# Patient Record
Sex: Female | Born: 1995 | Race: White | Hispanic: No | Marital: Married | Smoking: Former smoker
Health system: Southern US, Community
[De-identification: ages and names within clinical notes are randomized; demographics above are authoritative.]

## PROBLEM LIST (undated history)

## (undated) ENCOUNTER — Inpatient Hospital Stay (HOSPITAL_COMMUNITY): Payer: Self-pay

## (undated) DIAGNOSIS — N189 Chronic kidney disease, unspecified: Secondary | ICD-10-CM

## (undated) DIAGNOSIS — F319 Bipolar disorder, unspecified: Secondary | ICD-10-CM

## (undated) DIAGNOSIS — O1500 Eclampsia in pregnancy, unspecified trimester: Secondary | ICD-10-CM

## (undated) DIAGNOSIS — R569 Unspecified convulsions: Secondary | ICD-10-CM

## (undated) HISTORY — PX: APPENDECTOMY: SHX54

---
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
## 2013-02-10 ENCOUNTER — Emergency Department (HOSPITAL_BASED_OUTPATIENT_CLINIC_OR_DEPARTMENT_OTHER)
Admission: EM | Admit: 2013-02-10 | Discharge: 2013-02-11 | Disposition: A | Discharge status: Home / Self Care | Payer: BC Managed Care – PPO | Attending: Emergency Medicine | Admitting: Emergency Medicine

## 2013-02-10 ENCOUNTER — Encounter (HOSPITAL_BASED_OUTPATIENT_CLINIC_OR_DEPARTMENT_OTHER): Payer: Self-pay | Admitting: *Deleted

## 2013-02-10 DIAGNOSIS — N189 Chronic kidney disease, unspecified: Secondary | ICD-10-CM | POA: Insufficient documentation

## 2013-02-10 DIAGNOSIS — L0390 Cellulitis, unspecified: Secondary | ICD-10-CM

## 2013-02-10 DIAGNOSIS — L03818 Cellulitis of other sites: Secondary | ICD-10-CM | POA: Insufficient documentation

## 2013-02-10 DIAGNOSIS — Z79899 Other long term (current) drug therapy: Secondary | ICD-10-CM | POA: Insufficient documentation

## 2013-02-10 DIAGNOSIS — L02818 Cutaneous abscess of other sites: Secondary | ICD-10-CM | POA: Insufficient documentation

## 2013-02-10 DIAGNOSIS — Z8669 Personal history of other diseases of the nervous system and sense organs: Secondary | ICD-10-CM | POA: Insufficient documentation

## 2013-02-10 DIAGNOSIS — Z87442 Personal history of urinary calculi: Secondary | ICD-10-CM | POA: Insufficient documentation

## 2013-02-10 MED ORDER — AMOXICILLIN-POT CLAVULANATE 875-125 MG PO TABS: 1.0000 | ORAL_TABLET | Freq: Two times a day (BID) | ORAL | Status: DC

## 2013-02-10 NOTE — ED Notes (Signed)
Pt has raised reddened area to forehead.

## 2013-02-10 NOTE — ED Provider Notes (Signed)
History    This chart was scribed for Olivia Deatley Smitty Cords, MD scribed by Magnus Sinning. The patient was seen in room MH02/MH02 23:45    CSN: [id number]  Arrival date & time 02/10/13  2152    Chief Complaint  Patient presents with  . Abscess    (Consider location/radiation/quality/duration/timing/severity/associated sxs/prior treatment) Patient is a 17 y.o. female presenting with abscess. The history is provided by the patient.  Abscess Location:  Head/neck Head/neck abscess location:  Head Size:  Pimple Abscess quality: redness   Abscess quality: not weeping   Red streaking: no   Progression:  Unchanged Chronicity:  New Context: skin injury   Context comment:  Picked the pimple Relieved by:  Nothing Worsened by:  Nothing tried Ineffective treatments:  None tried Associated symptoms: no fever    Olivia [name] is a 17 y.o. female who presents to the Emergency Department complaining of a raised area to forehead, onset several days ago with associated redness and swelling. The patient states she noticed a small pimple to her forehead. She says that she popped it and that it improved initially, but then worsened since this morning after waking up.   Past Medical History  Diagnosis Date  . Seizures 10-21-2012    x 2 ; No prior history  . Chronic kidney disease     Frequent kidney stones; stent placed 2012    Past Surgical History  Procedure Laterality Date  . Cystoscopy with stent placement  2012  . Cesarean section  10/22/2012    Procedure: CESAREAN SECTION;  Surgeon: Levi Aland, MD;  Location: [facility];  Service: Obstetrics;  Laterality: N/A;    Family History  Problem Relation Age of Onset  . Other Neg Hx   . Hyperlipidemia Mother   . Coronary artery disease Mother 55    S/P CABG    History  Substance Use Topics  . Smoking status: Never Smoker   . Smokeless tobacco: Never Used  . Alcohol Use: No    OB History   Grav Para Term Preterm Abortions  TAB SAB Ect Mult Living   1 1 1  0 0 0 0 0 0 1      Review of Systems  Constitutional: Negative for fever.  Skin:       Red raised area to forehead  All other systems reviewed and are negative.    Allergies  Review of patient's allergies indicates no known allergies.  Home Medications   Current Outpatient Rx  Name  Route  Sig  Dispense  Refill  . acetaminophen (TYLENOL) 325 MG tablet   Oral   Take 325 mg by mouth every 6 (six) hours as needed. Takes for headaches         . IRON PO   Oral   Take 1 tablet by mouth daily.         Marland Kitchen LORazepam (ATIVAN) 1 MG tablet   Oral   Take 1 tablet (1 mg total) by mouth every 6 (six) hours as needed for anxiety.   20 tablet   0   . NIFEdipine (PROCARDIA-XL/ADALAT CC) 30 MG 24 hr tablet   Oral   Take 1 tablet (30 mg total) by mouth daily.   30 tablet   1   . oxyCODONE-acetaminophen (PERCOCET/ROXICET) 5-325 MG per tablet   Oral   Take 1-2 tablets by mouth every 4 (four) hours as needed (moderate - severe pain).   30 tablet   0   . Prenatal  Vit-Fe Fumarate-FA (PRENATAL MULTIVITAMIN) TABS   Oral   Take 1 tablet by mouth daily.           BP 120/68  Pulse 80  Temp(Src) 98.6 F (37 C) (Oral)  Resp 18  Ht 5\' 1"  (1.549 m)  Wt 114 lb (51.71 kg)  BMI 21.55 kg/m2  SpO2 99%  LMP 02/03/2013  Breastfeeding? No  Physical Exam  Nursing note and vitals reviewed. Constitutional: She is oriented to person, place, and time. She appears well-developed and well-nourished. No distress.  HENT:  Head: Normocephalic and atraumatic.    Mouth/Throat: Oropharynx is clear and moist. No oropharyngeal exudate.  Eyes: Conjunctivae and EOM are normal. Pupils are equal, round, and reactive to light.  Neck: Neck supple. No tracheal deviation present.  Cardiovascular: Normal rate, regular rhythm and normal heart sounds.   No murmur heard. Pulmonary/Chest: Breath sounds normal. No respiratory distress. She has no wheezes. She has no rales.   Abdominal: Soft. Bowel sounds are normal. She exhibits no distension. There is no tenderness.  Musculoskeletal: Normal range of motion.  Neurological: She is alert and oriented to person, place, and time. No sensory deficit.  Skin: Skin is warm and dry.  Sebaceous gland that has now become infected with now looks like cellulitis. Surrounding swelling noted.   Psychiatric: She has a normal mood and affect. Her behavior is normal.    ED Course  Procedures (including critical care time) DIAGNOSTIC STUDIES: Oxygen Saturation is 99% on room air, normal by my interpretation.    COORDINATION OF CARE: 23:46: physical exam performed   Labs Reviewed - No data to display No results found.   No diagnosis found.    MDM  Recheck in 2 days, sooner for worsening swelling, fevers, streaking or any concerns.  Follow up with facial.   I personally performed the services described in this documentation, which was scribed in my presence. The recorded information has been reviewed and is accurate.          Jasmine Awe, MD 02/11/13 [phone]

## 2013-02-11 ENCOUNTER — Encounter (HOSPITAL_BASED_OUTPATIENT_CLINIC_OR_DEPARTMENT_OTHER): Payer: Self-pay

## 2013-02-11 ENCOUNTER — Emergency Department (HOSPITAL_BASED_OUTPATIENT_CLINIC_OR_DEPARTMENT_OTHER)
Admission: EM | Admit: 2013-02-11 | Discharge: 2013-02-11 | Disposition: A | Discharge status: Home / Self Care | Payer: BC Managed Care – PPO | Attending: Emergency Medicine | Admitting: Emergency Medicine

## 2013-02-11 DIAGNOSIS — Z8669 Personal history of other diseases of the nervous system and sense organs: Secondary | ICD-10-CM | POA: Insufficient documentation

## 2013-02-11 DIAGNOSIS — Z87442 Personal history of urinary calculi: Secondary | ICD-10-CM | POA: Insufficient documentation

## 2013-02-11 DIAGNOSIS — L0201 Cutaneous abscess of face: Secondary | ICD-10-CM | POA: Insufficient documentation

## 2013-02-11 DIAGNOSIS — L03211 Cellulitis of face: Secondary | ICD-10-CM | POA: Insufficient documentation

## 2013-02-11 DIAGNOSIS — Z79899 Other long term (current) drug therapy: Secondary | ICD-10-CM | POA: Insufficient documentation

## 2013-02-11 DIAGNOSIS — Z5189 Encounter for other specified aftercare: Secondary | ICD-10-CM

## 2013-02-11 MED ORDER — AMOXICILLIN-POT CLAVULANATE 875-125 MG PO TABS
1.0000 | ORAL_TABLET | Freq: Once | ORAL | Status: AC
  Administered 2013-02-11: 1 via ORAL
  Filled 2013-02-11: qty 1

## 2013-02-11 MED ORDER — LIDOCAINE-EPINEPHRINE 2 %-1:100000 IJ SOLN
INTRAMUSCULAR | Status: AC
  Administered 2013-02-11: 1 mL
  Filled 2013-02-11: qty 1

## 2013-02-11 NOTE — ED Notes (Signed)
Pt states that she had abscess to R upper orbit.  Pt states that she now has incr swelling to R lower orbit.  Pt states that she her face feels tighter in this area despite minor looking appearance.

## 2013-02-11 NOTE — ED Provider Notes (Addendum)
History     CSN: [id number]  Arrival date & time 02/11/13  1155   First MD Initiated Contact with Patient 02/11/13 1217      Chief Complaint  Patient presents with  . Wound Check    (Consider location/radiation/quality/duration/timing/severity/associated sxs/prior treatment) Patient is a 17 y.o. female presenting with wound check. The history is provided by the patient.  Wound Check This is a new problem. The current episode started 2 days ago. The problem occurs constantly. The problem has been rapidly worsening. Associated symptoms comments: Seen in the ER last night and given augmentin for abscess but now worsening with swelling under the eye and worsening pain. Exacerbated by: Palpation. Nothing relieves the symptoms. Treatments tried: Antibiotic. The treatment provided no relief.    Past Medical History  Diagnosis Date  . Seizures 10-21-2012    x 2 ; No prior history  . Chronic kidney disease     Frequent kidney stones; stent placed 2012    Past Surgical History  Procedure Laterality Date  . Cystoscopy with stent placement  2012  . Cesarean section  10/22/2012    Procedure: CESAREAN SECTION;  Surgeon: Levi Aland, MD;  Location: [facility];  Service: Obstetrics;  Laterality: N/A;    Family History  Problem Relation Age of Onset  . Other Neg Hx   . Hyperlipidemia Mother   . Coronary artery disease Mother 33    S/P CABG    History  Substance Use Topics  . Smoking status: Never Smoker   . Smokeless tobacco: Never Used  . Alcohol Use: No    OB History   Grav Para Term Preterm Abortions TAB SAB Ect Mult Living   1 1 1  0 0 0 0 0 0 1      Review of Systems  Constitutional: Negative for fever.  Eyes: Negative for pain and redness.  All other systems reviewed and are negative.    Allergies  Review of patient's allergies indicates no known allergies.  Home Medications   Current Outpatient Rx  Name  Route  Sig  Dispense  Refill  . amoxicillin-clavulanate  (AUGMENTIN) 875-125 MG per tablet   Oral   Take 1 tablet by mouth 2 (two) times daily. One po bid x 7 days   14 tablet   0   . IRON PO   Oral   Take 1 tablet by mouth daily.         Marland Kitchen LORazepam (ATIVAN) 1 MG tablet   Oral   Take 1 tablet (1 mg total) by mouth every 6 (six) hours as needed for anxiety.   20 tablet   0   . naproxen (NAPROSYN) 500 MG tablet   Oral   Take 500 mg by mouth 2 (two) times daily with a meal.         . acetaminophen (TYLENOL) 325 MG tablet   Oral   Take 325 mg by mouth every 6 (six) hours as needed. Takes for headaches         . NIFEdipine (PROCARDIA-XL/ADALAT CC) 30 MG 24 hr tablet   Oral   Take 1 tablet (30 mg total) by mouth daily.   30 tablet   1   . oxyCODONE-acetaminophen (PERCOCET/ROXICET) 5-325 MG per tablet   Oral   Take 1-2 tablets by mouth every 4 (four) hours as needed (moderate - severe pain).   30 tablet   0   . Prenatal Vit-Fe Fumarate-FA (PRENATAL MULTIVITAMIN) TABS   Oral  Take 1 tablet by mouth daily.           BP 111/72  Pulse 97  Temp(Src) 99.2 F (37.3 C) (Oral)  Resp 15  Ht 5\' 1"  (1.549 m)  Wt 114 lb (51.71 kg)  BMI 21.55 kg/m2  SpO2 100%  LMP 02/03/2013  Physical Exam  Nursing note and vitals reviewed. Constitutional: She appears well-developed and well-nourished. No distress.  HENT:  Head: Normocephalic and atraumatic.    Eyes: Conjunctivae and EOM are normal. Pupils are equal, round, and reactive to light.  Extraocular movements are not painful  Cardiovascular: Normal rate.   Pulmonary/Chest: Effort normal.  Neurological: She is alert.  Skin: Skin is warm and dry.    ED Course  Procedures (including critical care time)  Labs Reviewed - No data to display No results found.  INCISION AND DRAINAGE Performed by: Gwyneth Sprout Consent: Verbal consent obtained. Risks and benefits: risks, benefits and alternatives were discussed Type: abscess  Body area: Right eyebrow  Anesthesia:  local infiltration  Incision was made with an 18-gauge needle  Local anesthetic: lidocaine 1 % with epinephrine  Anesthetic total: 2 ml  Complexity: Simple  Drainage: purulent  Drainage amount: 2   Packing material: none  Patient tolerance: Patient tolerated the procedure well with no immediate complications.     1. Abscess re-check       MDM   Patient was seen within the last 24 hours for facial abscess and was started on Augmentin however at that time there was no drainage or I&D. Patient states her symptoms are worsening and now having pain and swelling with swelling in the infraorbital area. She has no symptoms concerning for periorbital or orbital cellulitis. I&D performed with moderate post drainage. Patient encouraged to continue antibiotics and do warm soaks and return for any worsening symptoms.        Gwyneth Sprout, MD 02/11/13 1242  Gwyneth Sprout, MD 02/11/13 1244

## 2013-04-06 DIAGNOSIS — N189 Chronic kidney disease, unspecified: Secondary | ICD-10-CM | POA: Insufficient documentation

## 2013-04-06 DIAGNOSIS — S1093XA Contusion of unspecified part of neck, initial encounter: Secondary | ICD-10-CM | POA: Insufficient documentation

## 2013-04-06 DIAGNOSIS — W2209XA Striking against other stationary object, initial encounter: Secondary | ICD-10-CM | POA: Insufficient documentation

## 2013-04-06 DIAGNOSIS — Y92009 Unspecified place in unspecified non-institutional (private) residence as the place of occurrence of the external cause: Secondary | ICD-10-CM | POA: Insufficient documentation

## 2013-04-06 DIAGNOSIS — S8990XA Unspecified injury of unspecified lower leg, initial encounter: Secondary | ICD-10-CM | POA: Insufficient documentation

## 2013-04-06 DIAGNOSIS — Y9301 Activity, walking, marching and hiking: Secondary | ICD-10-CM | POA: Insufficient documentation

## 2013-04-06 DIAGNOSIS — Z79899 Other long term (current) drug therapy: Secondary | ICD-10-CM | POA: Insufficient documentation

## 2013-04-06 DIAGNOSIS — S0003XA Contusion of scalp, initial encounter: Secondary | ICD-10-CM | POA: Insufficient documentation

## 2013-04-06 DIAGNOSIS — Z8669 Personal history of other diseases of the nervous system and sense organs: Secondary | ICD-10-CM | POA: Insufficient documentation

## 2013-04-06 DIAGNOSIS — S99919A Unspecified injury of unspecified ankle, initial encounter: Secondary | ICD-10-CM | POA: Insufficient documentation

## 2013-04-07 ENCOUNTER — Emergency Department (HOSPITAL_COMMUNITY)
Admission: EM | Admit: 2013-04-07 | Discharge: 2013-04-07 | Disposition: A | Discharge status: Home / Self Care | Payer: BC Managed Care – PPO | Attending: Emergency Medicine | Admitting: Emergency Medicine

## 2013-04-07 ENCOUNTER — Emergency Department (HOSPITAL_COMMUNITY): Payer: BC Managed Care – PPO

## 2013-04-07 ENCOUNTER — Encounter (HOSPITAL_COMMUNITY): Payer: Self-pay | Admitting: Emergency Medicine

## 2013-04-07 DIAGNOSIS — M79675 Pain in left toe(s): Secondary | ICD-10-CM

## 2013-04-07 DIAGNOSIS — S0083XA Contusion of other part of head, initial encounter: Secondary | ICD-10-CM

## 2013-04-07 MED ORDER — NAPROXEN 500 MG PO TABS: 500.0000 mg | ORAL_TABLET | Freq: Two times a day (BID) | ORAL | Status: DC

## 2013-04-07 NOTE — ED Provider Notes (Signed)
History     CSN: [id number]  Arrival date & time 04/06/13  2355   First MD Initiated Contact with Patient 04/07/13 0050      Chief Complaint  Patient presents with  . Facial Pain    (Consider location/radiation/quality/duration/timing/severity/associated sxs/prior treatment) HPI Olivia [name] is a 17 y.o. female who presents to ER for 2 separate mechanical injuries that were both accidental per pt. 1- she was walking and kicked her night stand stubbing her left pinky toe. 2 she was opening door and hit the left side of her face and has a mild HA and nausea since. She denies change in vision, LOC, emesis or ataxia. Pain level is 3/10.   Past Medical History  Diagnosis Date  . Seizures 10-21-2012    x 2 ; No prior history  . Chronic kidney disease     Frequent kidney stones; stent placed 2012    Past Surgical History  Procedure Laterality Date  . Cystoscopy with stent placement  2012  . Cesarean section  10/22/2012    Procedure: CESAREAN SECTION;  Surgeon: Levi Aland, MD;  Location: [facility];  Service: Obstetrics;  Laterality: N/A;    Family History  Problem Relation Age of Onset  . Other Neg Hx   . Hyperlipidemia Mother   . Coronary artery disease Mother 1    S/P CABG    History  Substance Use Topics  . Smoking status: Never Smoker   . Smokeless tobacco: Never Used  . Alcohol Use: No    OB History   Grav Para Term Preterm Abortions TAB SAB Ect Mult Living   1 1 1  0 0 0 0 0 0 1      Review of Systems  All other systems reviewed and are negative.    Allergies  Review of patient's allergies indicates no known allergies.  Home Medications   Current Outpatient Rx  Name  Route  Sig  Dispense  Refill  . ibuprofen (ADVIL,MOTRIN) 200 MG tablet   Oral   Take 400 mg by mouth every 6 (six) hours as needed for pain.         . Levonorgestrel-Ethinyl Estrad (PORTIA-28 PO)   Oral   Take 1 tablet by mouth daily.         Marland Kitchen LORazepam (ATIVAN) 0.5 MG  tablet   Oral   Take 0.5 mg by mouth every 8 (eight) hours as needed for anxiety.           BP 115/74  Temp(Src) 98.2 F (36.8 C) (Oral)  Resp 18  SpO2 99%  LMP 03/30/2013  Physical Exam  Nursing note and vitals reviewed. Constitutional: She appears well-developed and well-nourished. No distress.  HENT:  Head: Normocephalic.  Mild facial bruising & swelling over the inferior orbit.   Eyes: Conjunctivae and EOM are normal.  No entrapment  Neck: Normal range of motion. Neck supple.  Cardiovascular:  Intact distal pulses, capillary refill < 3 seconds  Musculoskeletal:  Left pinky tender w bruising, & painful ROM. All other extremities with normal ROM  Neurological:  No ataxia, good coordination. CN intact No sensory deficit  Skin: She is not diaphoretic.  Skin intact, no tenting    ED Course  Procedures (including critical care time)  Labs Reviewed - No data to display Dg Foot Complete Left  04/07/2013   *RADIOLOGY REPORT*  Clinical Data: Lateral foot pain after injury.  LEFT FOOT - COMPLETE 3+ VIEW  Comparison: None.  Findings: No fracture or  acute bony findings.  Lisfranc joint alignment normal.  Base of the fifth metatarsal intact.  IMPRESSION:  1.  No acute bony findings.   Original Report Authenticated By: Gaylyn Rong, M.D.     1. Toe pain, left   2. Facial bruising, initial encounter       MDM          Jaci Carrel, PA-C 04/07/13 0559

## 2013-04-07 NOTE — ED Notes (Signed)
Pt reports that she thinks she may have broken her left foot while kicking a nightstand

## 2013-04-07 NOTE — ED Notes (Signed)
PT. ACCIDENTALLY HIT BY A DOOR AT LEFT SIDE OF FACE THIS EVENING , NO LOC / AMBULATORY , REPORTS PAIN AT LEFT LATERAL FOREHEAD / LEFT CHEEK AND BACK OF HEAD .

## 2013-04-07 NOTE — ED Provider Notes (Signed)
Medical screening examination/treatment/procedure(s) were performed by non-physician practitioner and as supervising physician I was immediately available for consultation/collaboration.  Oluwadamilare Tobler M Yvon Mccord, MD 04/07/13 0627 

## 2013-06-12 ENCOUNTER — Encounter (HOSPITAL_BASED_OUTPATIENT_CLINIC_OR_DEPARTMENT_OTHER): Payer: Self-pay | Admitting: *Deleted

## 2013-06-12 ENCOUNTER — Emergency Department (HOSPITAL_BASED_OUTPATIENT_CLINIC_OR_DEPARTMENT_OTHER)
Admission: EM | Admit: 2013-06-12 | Discharge: 2013-06-12 | Disposition: A | Discharge status: Home / Self Care | Payer: BC Managed Care – PPO | Attending: Emergency Medicine | Admitting: Emergency Medicine

## 2013-06-12 ENCOUNTER — Emergency Department (HOSPITAL_BASED_OUTPATIENT_CLINIC_OR_DEPARTMENT_OTHER): Payer: BC Managed Care – PPO

## 2013-06-12 DIAGNOSIS — B9689 Other specified bacterial agents as the cause of diseases classified elsewhere: Secondary | ICD-10-CM | POA: Insufficient documentation

## 2013-06-12 DIAGNOSIS — Z87442 Personal history of urinary calculi: Secondary | ICD-10-CM | POA: Insufficient documentation

## 2013-06-12 DIAGNOSIS — R109 Unspecified abdominal pain: Secondary | ICD-10-CM | POA: Insufficient documentation

## 2013-06-12 DIAGNOSIS — Z8669 Personal history of other diseases of the nervous system and sense organs: Secondary | ICD-10-CM | POA: Insufficient documentation

## 2013-06-12 DIAGNOSIS — Z3202 Encounter for pregnancy test, result negative: Secondary | ICD-10-CM | POA: Insufficient documentation

## 2013-06-12 DIAGNOSIS — R110 Nausea: Secondary | ICD-10-CM | POA: Insufficient documentation

## 2013-06-12 DIAGNOSIS — A499 Bacterial infection, unspecified: Secondary | ICD-10-CM | POA: Insufficient documentation

## 2013-06-12 DIAGNOSIS — N189 Chronic kidney disease, unspecified: Secondary | ICD-10-CM | POA: Insufficient documentation

## 2013-06-12 DIAGNOSIS — N760 Acute vaginitis: Secondary | ICD-10-CM

## 2013-06-12 LAB — URINALYSIS, ROUTINE W REFLEX MICROSCOPIC
Bilirubin Urine: NEGATIVE
Nitrite: NEGATIVE
Protein, ur: 100 mg/dL — AB
Specific Gravity, Urine: 1.024 (ref 1.005–1.030)
Urobilinogen, UA: 1 mg/dL (ref 0.0–1.0)

## 2013-06-12 LAB — URINE MICROSCOPIC-ADD ON

## 2013-06-12 LAB — WET PREP, GENITAL
Trich, Wet Prep: NONE SEEN
Yeast Wet Prep HPF POC: NONE SEEN

## 2013-06-12 MED ORDER — OXYCODONE-ACETAMINOPHEN 5-325 MG PO TABS: 1.0000 | ORAL_TABLET | ORAL | Status: DC | PRN

## 2013-06-12 MED ORDER — ONDANSETRON HCL 4 MG/2ML IJ SOLN
4.0000 mg | Freq: Once | INTRAMUSCULAR | Status: AC
  Administered 2013-06-12: 4 mg via INTRAVENOUS

## 2013-06-12 MED ORDER — METRONIDAZOLE 500 MG PO TABS
500.0000 mg | ORAL_TABLET | Freq: Once | ORAL | Status: AC
  Administered 2013-06-12: 500 mg via ORAL
  Filled 2013-06-12: qty 1

## 2013-06-12 MED ORDER — HYDROMORPHONE HCL PF 1 MG/ML IJ SOLN
1.0000 mg | Freq: Once | INTRAMUSCULAR | Status: AC
  Administered 2013-06-12: 1 mg via INTRAVENOUS

## 2013-06-12 MED ORDER — METRONIDAZOLE 500 MG PO TABS: 500.0000 mg | ORAL_TABLET | Freq: Two times a day (BID) | ORAL | Status: DC

## 2013-06-12 MED ORDER — ONDANSETRON HCL 4 MG/2ML IJ SOLN
INTRAMUSCULAR | Status: AC
  Filled 2013-06-12: qty 2

## 2013-06-12 MED ORDER — HYDROMORPHONE HCL PF 1 MG/ML IJ SOLN
INTRAMUSCULAR | Status: AC
  Filled 2013-06-12: qty 1

## 2013-06-12 MED ORDER — OXYCODONE-ACETAMINOPHEN 5-325 MG PO TABS
1.0000 | ORAL_TABLET | Freq: Once | ORAL | Status: AC
  Administered 2013-06-12: 1 via ORAL
  Filled 2013-06-12 (×2): qty 1

## 2013-06-12 NOTE — ED Provider Notes (Addendum)
History    CSN: [id number] Arrival date & time 06/12/13  1752  First MD Initiated Contact with Patient 06/12/13 1800     Chief Complaint  Patient presents with  . Flank Pain   (Consider location/radiation/quality/duration/timing/severity/associated sxs/prior Treatment) Patient is a 17 y.o. female presenting with flank pain. The history is provided by the patient. No language interpreter was used.  Flank Pain This is a new (Patient is a 17 year old woman with a history of kidney stones who had the onset of left flank pain on 1 AM. The pain comes and goes. It is slowly migrated into her left lower quadrant.) problem. The current episode started 12 to 24 hours ago. Episode frequency: Pain comes and goes. The problem has not changed since onset.Pertinent negatives include no chest pain, no abdominal pain, no headaches and no shortness of breath. Nothing aggravates the symptoms. Nothing relieves the symptoms. She has tried nothing for the symptoms.   Past Medical History  Diagnosis Date  . Seizures 10-21-2012    x 2 ; No prior history  . Chronic kidney disease     Frequent kidney stones; stent placed 2012   Past Surgical History  Procedure Laterality Date  . Cystoscopy with stent placement  2012  . Cesarean section  10/22/2012    Procedure: CESAREAN SECTION;  Surgeon: Levi Aland, MD;  Location: [facility];  Service: Obstetrics;  Laterality: N/A;   Family History  Problem Relation Age of Onset  . Other Neg Hx   . Hyperlipidemia Mother   . Coronary artery disease Mother 52    S/P CABG   History  Substance Use Topics  . Smoking status: Never Smoker   . Smokeless tobacco: Never Used  . Alcohol Use: No   OB History   Grav Para Term Preterm Abortions TAB SAB Ect Mult Living   1 1 1  0 0 0 0 0 0 1     Review of Systems  Constitutional: Negative for fever and chills.  HENT: Negative.   Eyes: Negative.   Respiratory: Negative.  Negative for shortness of breath.    Cardiovascular: Negative for chest pain.  Gastrointestinal: Positive for nausea. Negative for vomiting, abdominal pain and diarrhea.  Genitourinary: Positive for flank pain. Negative for hematuria and menstrual problem.       LMP 3 wweks ago.  Prior kidney stent and stone surgery at [facility] 3 years ago.   Musculoskeletal: Negative.   Skin: Negative.   Neurological: Negative.  Negative for headaches.  Psychiatric/Behavioral: Negative.     Allergies  Review of patient's allergies indicates no known allergies.  Home Medications   Current Outpatient Rx  Name  Route  Sig  Dispense  Refill  . FLUoxetine (PROZAC) 20 MG capsule   Oral   Take 20 mg by mouth daily.         Marland Kitchen ibuprofen (ADVIL,MOTRIN) 200 MG tablet   Oral   Take 400 mg by mouth every 6 (six) hours as needed for pain.         . Levonorgestrel-Ethinyl Estrad (PORTIA-28 PO)   Oral   Take 1 tablet by mouth daily.         Marland Kitchen LORazepam (ATIVAN) 0.5 MG tablet   Oral   Take 0.5 mg by mouth every 8 (eight) hours as needed for anxiety.         . naproxen (NAPROSYN) 500 MG tablet   Oral   Take 1 tablet (500 mg total) by mouth 2 (two) times  daily.   30 tablet   0    BP 113/74  Pulse 98  Temp(Src) 98.7 F (37.1 C) (Oral)  Resp 20  Wt 114 lb (51.71 kg)  BMI 21.55 kg/m2  SpO2 99%  LMP 05/22/2013 Physical Exam  Constitutional: She is oriented to person, place, and time. She appears well-developed and well-nourished.  In moderate distress with left flank pain, rated at a 5.    HENT:  Head: Normocephalic and atraumatic.  Right Ear: External ear normal.  Left Ear: External ear normal.  Mouth/Throat: Oropharynx is clear and moist.  Eyes: Conjunctivae are normal. Pupils are equal, round, and reactive to light. No scleral icterus.  Neck: Normal range of motion. Neck supple.  Cardiovascular: Normal rate, regular rhythm and normal heart sounds.   Pulmonary/Chest: Effort normal and breath sounds normal.  Abdominal:  Soft. Bowel sounds are normal.  Genitourinary:  Normal female external genitalia.  She has a thick white discharge.  No uterine or adnexal tenderness or mass.  Musculoskeletal: Normal range of motion. She exhibits no edema and no tenderness.  Pain is localized by pt to L CVA region with radiation towards the LLQ.  She has no tenderness to palpation.  Neurological: She is alert and oriented to person, place, and time.  No sensory or motor deficit.  Skin: Skin is warm and dry.  Psychiatric: She has a normal mood and affect. Her behavior is normal.    ED Course  Procedures (including critical care time) Labs Reviewed  PREGNANCY, URINE  URINALYSIS, ROUTINE W REFLEX MICROSCOPIC   6:19 PM Pt seen --> physical exam performed.  IV medications for pain and nausea ordered.  UA, UPG, CT abdomen/pelvis without contrast ordered.  8:27 PM Results for orders placed during the hospital encounter of 06/12/13  WET PREP, GENITAL      Result Value Range   Yeast Wet Prep HPF POC NONE SEEN  NONE SEEN   Trich, Wet Prep NONE SEEN  NONE SEEN   Clue Cells Wet Prep HPF POC MODERATE (*) NONE SEEN   WBC, Wet Prep HPF POC MANY (*) NONE SEEN  URINALYSIS, ROUTINE W REFLEX MICROSCOPIC      Result Value Range   Color, Urine YELLOW  YELLOW   APPearance CLEAR  CLEAR   Specific Gravity, Urine 1.024  1.005 - 1.030   pH 6.0  5.0 - 8.0   Glucose, UA NEGATIVE  NEGATIVE mg/dL   Hgb urine dipstick NEGATIVE  NEGATIVE   Bilirubin Urine NEGATIVE  NEGATIVE   Ketones, ur NEGATIVE  NEGATIVE mg/dL   Protein, ur 161 (*) NEGATIVE mg/dL   Urobilinogen, UA 1.0  0.0 - 1.0 mg/dL   Nitrite NEGATIVE  NEGATIVE   Leukocytes, UA NEGATIVE  NEGATIVE  PREGNANCY, URINE      Result Value Range   Preg Test, Ur NEGATIVE  NEGATIVE  URINE MICROSCOPIC-ADD ON      Result Value Range   Squamous Epithelial / LPF RARE  RARE   WBC, UA 0-2  <3 WBC/hpf   Bacteria, UA FEW (*) RARE   Ct Abdomen Pelvis Wo Contrast  06/12/2013   *RADIOLOGY  REPORT*  Clinical Data: Left flank pain.  Left lower quadrant pain.  CT ABDOMEN AND PELVIS WITHOUT CONTRAST  Technique:  Multidetector CT imaging of the abdomen and pelvis was performed following the standard protocol without intravenous contrast.  Comparison: 07/23/2009  Findings: No hydronephrosis.  No urinary calculus.  Normal appearing appendix containing gas and some high-density material.  Terminal  ileum is unremarkable.  Small amount of free fluid is seen in the dependent portion of the pelvis.  Uterus is unremarkable.  Adnexa are unremarkable.  Unenhanced liver, gallbladder, spleen, pancreas, adrenal glands are within normal limits.  No free intraperitoneal gas.  Mild levoscoliosis may simply be positional and occurs with its apex at L1-2.  IMPRESSION: No evidence of urinary calculus or urinary obstruction.  Small amount of free fluid in the pelvis is likely physiologic.   Original Report Authenticated By: Jolaine Click, M.D.   UA, CT abdomen/pelvis showed no evidence of kidney stone.  Pelvic exam showed a white discharge, and wet prep showed bacterial vaginosis.  Rx with metronidazole to treat bacterial vaginosis, Percocet q4h prn pain.  F/U with her OBGYN, Dr, Malva Limes.  1. Bacterial vaginosis         Carleene Cooper III, MD 06/12/13 2031     Carleene Cooper III, MD 06/12/13 2035

## 2013-06-12 NOTE — ED Notes (Signed)
Left flank and lower abd pain since early am. Hx of kidney stones.

## 2013-06-12 NOTE — ED Notes (Signed)
Pt returned from radiology.

## 2013-06-13 LAB — GC/CHLAMYDIA PROBE AMP: GC Probe RNA: NEGATIVE

## 2013-06-14 ENCOUNTER — Emergency Department (HOSPITAL_COMMUNITY)
Admission: EM | Admit: 2013-06-14 | Discharge: 2013-06-15 | Disposition: A | Discharge status: Home / Self Care | Payer: BC Managed Care – PPO | Attending: Emergency Medicine | Admitting: Emergency Medicine

## 2013-06-14 ENCOUNTER — Encounter (HOSPITAL_COMMUNITY): Payer: Self-pay | Admitting: Emergency Medicine

## 2013-06-14 ENCOUNTER — Emergency Department (HOSPITAL_COMMUNITY): Payer: BC Managed Care – PPO

## 2013-06-14 DIAGNOSIS — R109 Unspecified abdominal pain: Secondary | ICD-10-CM | POA: Insufficient documentation

## 2013-06-14 DIAGNOSIS — Z8669 Personal history of other diseases of the nervous system and sense organs: Secondary | ICD-10-CM | POA: Insufficient documentation

## 2013-06-14 DIAGNOSIS — B9689 Other specified bacterial agents as the cause of diseases classified elsewhere: Secondary | ICD-10-CM

## 2013-06-14 DIAGNOSIS — Z87442 Personal history of urinary calculi: Secondary | ICD-10-CM | POA: Insufficient documentation

## 2013-06-14 DIAGNOSIS — Z79899 Other long term (current) drug therapy: Secondary | ICD-10-CM | POA: Insufficient documentation

## 2013-06-14 DIAGNOSIS — N760 Acute vaginitis: Secondary | ICD-10-CM | POA: Insufficient documentation

## 2013-06-14 LAB — COMPREHENSIVE METABOLIC PANEL
AST: 25 U/L (ref 0–37)
Albumin: 2.6 g/dL — ABNORMAL LOW (ref 3.5–5.2)
BUN: 7 mg/dL (ref 6–23)
Calcium: 7.9 mg/dL — ABNORMAL LOW (ref 8.4–10.5)
Chloride: 106 mEq/L (ref 96–112)
Creatinine, Ser: 0.76 mg/dL (ref 0.50–1.10)
Total Protein: 6.3 g/dL (ref 6.0–8.3)

## 2013-06-14 LAB — CBC
HCT: 32.9 % — ABNORMAL LOW (ref 36.0–46.0)
MCHC: 34.7 g/dL (ref 30.0–36.0)
MCV: 80.2 fL (ref 78.0–100.0)
Platelets: 224 10*3/uL (ref 150–400)
RDW: 12.5 % (ref 11.5–15.5)
WBC: 16.9 10*3/uL — ABNORMAL HIGH (ref 4.0–10.5)

## 2013-06-14 LAB — URINALYSIS, ROUTINE W REFLEX MICROSCOPIC
Glucose, UA: NEGATIVE mg/dL
Ketones, ur: >80 mg/dL — AB
Nitrite: NEGATIVE
Specific Gravity, Urine: 1.021 (ref 1.005–1.030)
pH: 6.5 (ref 5.0–8.0)

## 2013-06-14 LAB — URINE MICROSCOPIC-ADD ON

## 2013-06-14 LAB — LIPASE, BLOOD: Lipase: 15 U/L (ref 11–59)

## 2013-06-14 MED ORDER — FENTANYL CITRATE 0.05 MG/ML IJ SOLN
50.0000 ug | Freq: Once | INTRAMUSCULAR | Status: AC
  Administered 2013-06-14: 50 ug via INTRAVENOUS

## 2013-06-14 MED ORDER — FENTANYL CITRATE 0.05 MG/ML IJ SOLN
50.0000 ug | Freq: Once | INTRAMUSCULAR | Status: AC
  Administered 2013-06-14: 50 ug via INTRAVENOUS
  Filled 2013-06-14: qty 2

## 2013-06-14 MED ORDER — SODIUM CHLORIDE 0.9 % IV SOLN
Freq: Once | INTRAVENOUS | Status: AC
  Administered 2013-06-14: 22:00:00 via INTRAVENOUS

## 2013-06-14 MED ORDER — SODIUM CHLORIDE 0.9 % IV BOLUS (SEPSIS)
1000.0000 mL | Freq: Once | INTRAVENOUS | Status: AC
  Administered 2013-06-14: 1000 mL via INTRAVENOUS

## 2013-06-14 MED ORDER — METRONIDAZOLE 500 MG PO TABS
2000.0000 mg | ORAL_TABLET | Freq: Once | ORAL | Status: AC
  Administered 2013-06-15: 2000 mg via ORAL
  Filled 2013-06-14: qty 4

## 2013-06-14 MED ORDER — ONDANSETRON HCL 4 MG/2ML IJ SOLN
4.0000 mg | Freq: Once | INTRAMUSCULAR | Status: AC
  Administered 2013-06-14: 4 mg via INTRAVENOUS
  Filled 2013-06-14: qty 2

## 2013-06-14 MED ORDER — ONDANSETRON 4 MG PO TBDP
4.0000 mg | ORAL_TABLET | Freq: Once | ORAL | Status: AC
  Administered 2013-06-15: 4 mg via ORAL
  Filled 2013-06-14 (×2): qty 1

## 2013-06-14 NOTE — ED Notes (Signed)
Per EMS: Pt states she has had n/v/d x1 day, with severe abdominal pain. Pt's original BP 88/60, currently 124/80 after of NS. Pt ax4, NAD.

## 2013-06-15 MED ORDER — OXYCODONE-ACETAMINOPHEN 5-325 MG PO TABS
1.0000 | ORAL_TABLET | Freq: Once | ORAL | Status: AC
  Administered 2013-06-15: 1 via ORAL
  Filled 2013-06-15: qty 1

## 2013-06-15 MED ORDER — ONDANSETRON 4 MG PO TBDP: 4.0000 mg | ORAL_TABLET | Freq: Three times a day (TID) | ORAL | Status: DC | PRN

## 2013-06-15 MED ORDER — BISACODYL 5 MG PO TBEC: 5.0000 mg | DELAYED_RELEASE_TABLET | Freq: Two times a day (BID) | ORAL | Status: DC

## 2013-06-15 MED ORDER — TRAMADOL HCL 50 MG PO TABS: 50.0000 mg | ORAL_TABLET | Freq: Four times a day (QID) | ORAL | Status: DC | PRN

## 2013-06-15 NOTE — ED Provider Notes (Signed)
CSN: [id number]     Arrival date & time 06/14/13  2042 History     First MD Initiated Contact with Patient 06/14/13 2050     Chief Complaint  Patient presents with  . Abdominal Pain   (Consider location/radiation/quality/duration/timing/severity/associated sxs/prior Treatment) HPI  Olivia [name] is a 17 y.o.female with a significant PMH of seizures, chronic kidney disease presents to the ER with complaints of n/v/d for 1 day with severe abdominal pains. She came by EMS and had a low BP originally but it improved with fluids.The patient was diagnosed with bacterial vaginosis on 06/12/2013 and given Metronidazole Rx. She said that the medication made her feel sick so she decided to not take it. Now her pain is worse. She denies vaginal discharge, dysuria, diarrhea, fevers.    Past Medical History  Diagnosis Date  . Seizures 10-21-2012    x 2 ; No prior history  . Chronic kidney disease     Frequent kidney stones; stent placed 2012   Past Surgical History  Procedure Laterality Date  . Cystoscopy with stent placement  2012  . Cesarean section  10/22/2012    Procedure: CESAREAN SECTION;  Surgeon: Levi Aland, MD;  Location: [facility];  Service: Obstetrics;  Laterality: N/A;   Family History  Problem Relation Age of Onset  . Other Neg Hx   . Hyperlipidemia Mother   . Coronary artery disease Mother 23    S/P CABG   History  Substance Use Topics  . Smoking status: Never Smoker   . Smokeless tobacco: Never Used  . Alcohol Use: No   OB History   Grav Para Term Preterm Abortions TAB SAB Ect Mult Living   1 1 1  0 0 0 0 0 0 1     Review of Systems ROS is negative unless otherwise stated in HPI.    Allergies  Review of patient's allergies indicates no known allergies.  Home Medications   Current Outpatient Rx  Name  Route  Sig  Dispense  Refill  . FLUoxetine (PROZAC) 20 MG capsule   Oral   Take 20 mg by mouth daily.         Marland Kitchen ibuprofen (ADVIL,MOTRIN) 200 MG tablet    Oral   Take 400 mg by mouth every 6 (six) hours as needed for pain.         . Levonorgestrel-Ethinyl Estrad (PORTIA-28 PO)   Oral   Take 1 tablet by mouth daily.         Marland Kitchen LORazepam (ATIVAN) 0.5 MG tablet   Oral   Take 0.5 mg by mouth every 8 (eight) hours as needed for anxiety.         . metroNIDAZOLE (FLAGYL) 500 MG tablet   Oral   Take 500 mg by mouth 2 (two) times daily. Start date 06/12/13; Duration unknown to pt         . oxyCODONE-acetaminophen (PERCOCET/ROXICET) 5-325 MG per tablet   Oral   Take 1 tablet by mouth every 4 (four) hours as needed for pain.   12 tablet   0   . bisacodyl (DULCOLAX) 5 MG EC tablet   Oral   Take 1 tablet (5 mg total) by mouth 2 (two) times daily.   14 tablet   0   . ondansetron (ZOFRAN ODT) 4 MG disintegrating tablet   Oral   Take 1 tablet (4 mg total) by mouth every 8 (eight) hours as needed for nausea.   20 tablet   0   .  traMADol (ULTRAM) 50 MG tablet   Oral   Take 1 tablet (50 mg total) by mouth every 6 (six) hours as needed for pain.   15 tablet   0    BP 106/62  Pulse 98  Temp(Src) 100.6 F (38.1 C) (Oral)  Resp 16  SpO2 99%  LMP 05/22/2013 Physical Exam  Nursing note and vitals reviewed. Constitutional: She appears well-developed and well-nourished. No distress.  HENT:  Head: Normocephalic and atraumatic.  Eyes: Pupils are equal, round, and reactive to light.  Neck: Normal range of motion. Neck supple.  Cardiovascular: Normal rate and regular rhythm.   Pulmonary/Chest: Effort normal.  Abdominal: Soft. There is tenderness in the suprapubic area. There is no rigidity, no guarding, no tenderness at McBurney's point and negative Murphy's sign.  Genitourinary: There is tenderness around the vagina. No foreign body around the vagina. No vaginal discharge found.  Pt denied repeat pelvic but allowed me to do a digital exam. She exhibits cervical tenderness that is mild.  Neurological: She is alert.  Skin: Skin is  warm and dry.    ED Course   Procedures (including critical care time)   US Transvaginal Non-OB (Final result)  Result time: 06/14/13 23:45:52    Final result by Rad Results In Interface (06/14/13 23:45:52)    Narrative:   *RADIOLOGY REPORT*  Clinical Data: Pelvic pain.  TRANSABDOMINAL AND TRANSVAGINAL ULTRASOUND OF PELVIS DOPPLER ULTRASOUND OF OVARIES  Technique: Both transabdominal and transvaginal ultrasound examinations of the pelvis were performed. Transabdominal technique was performed for global imaging of the pelvis including uterus, ovaries, adnexal regions, and pelvic cul-de-sac.  It was necessary to proceed with endovaginal exam following the transabdominal exam to visualize the ovaries.  Color and duplex Doppler ultrasound was utilized to evaluate blood flow to the ovaries.  Comparison: CT abdomen and pelvis 06/12/2013  FINDINGS  Uterus: The uterus is anteverted and measures 7.3 x 2.7 x 5.9 cm. No focal myometrial mass lesions. Cervix appears unremarkable.  Endometrium: Normal endometrial stripe thickness measured at 2.1 mm. No abnormal endometrial fluid collections.  Right ovary: The right ovary measures 2.5 x 3 x 2.1 cm. Normal follicular changes. Flow is demonstrated in the right ovary on color flow Doppler imaging. No abnormal adnexal masses.  Left ovary: Left ovary measures 3.1 x 1.1 x 2 cm. Normal follicular changes demonstrated. Flow is demonstrated in the left ovary on color flow Doppler imaging. No abnormal adnexal masses.  Pulsed Doppler evaluation demonstrates normal low-resistance arterial and venous waveforms in both ovaries.  Small moderate free fluid in the pelvis in the cul-de-sac and around the right ovary. No hydrosalpinx.  IMPRESSION:  Normal appearance of the uterus and ovaries. Moderate free fluid in the pelvis.  No sonographic evidence for ovarian torsion.   Original Report Authenticated By: Burman Nieves, M.D.              US Pelvis Complete (Final result)  Result time: 06/14/13 23:45:51    Final result by Rad Results In Interface (06/14/13 23:45:51)    Narrative:   *RADIOLOGY REPORT*  Clinical Data: Pelvic pain.  TRANSABDOMINAL AND TRANSVAGINAL ULTRASOUND OF PELVIS DOPPLER ULTRASOUND OF OVARIES  Technique: Both transabdominal and transvaginal ultrasound examinations of the pelvis were performed. Transabdominal technique was performed for global imaging of the pelvis including uterus, ovaries, adnexal regions, and pelvic cul-de-sac.  It was necessary to proceed with endovaginal exam following the transabdominal exam to visualize the ovaries.  Color and duplex Doppler ultrasound was utilized to evaluate  blood flow to the ovaries.  Comparison: CT abdomen and pelvis 06/12/2013  FINDINGS  Uterus: The uterus is anteverted and measures 7.3 x 2.7 x 5.9 cm. No focal myometrial mass lesions. Cervix appears unremarkable.  Endometrium: Normal endometrial stripe thickness measured at 2.1 mm. No abnormal endometrial fluid collections.  Right ovary: The right ovary measures 2.5 x 3 x 2.1 cm. Normal follicular changes. Flow is demonstrated in the right ovary on color flow Doppler imaging. No abnormal adnexal masses.  Left ovary: Left ovary measures 3.1 x 1.1 x 2 cm. Normal follicular changes demonstrated. Flow is demonstrated in the left ovary on color flow Doppler imaging. No abnormal adnexal masses.  Pulsed Doppler evaluation demonstrates normal low-resistance arterial and venous waveforms in both ovaries.  Small moderate free fluid in the pelvis in the cul-de-sac and around the right ovary. No hydrosalpinx.  IMPRESSION:  Normal appearance of the uterus and ovaries. Moderate free fluid in the pelvis.  No sonographic evidence for ovarian torsion.   Original Report Authenticated By: Burman Nieves, M.D.             [place] Art/Ven Flow Abd Pelv Doppler (Final result)  Result time:  06/14/13 23:45:51    Final result by Rad Results In Interface (06/14/13 23:45:51)    Narrative:   *RADIOLOGY REPORT*  Clinical Data: Pelvic pain.  TRANSABDOMINAL AND TRANSVAGINAL ULTRASOUND OF PELVIS DOPPLER ULTRASOUND OF OVARIES  Technique: Both transabdominal and transvaginal ultrasound examinations of the pelvis were performed. Transabdominal technique was performed for global imaging of the pelvis including uterus, ovaries, adnexal regions, and pelvic cul-de-sac.  It was necessary to proceed with endovaginal exam following the transabdominal exam to visualize the ovaries.  Color and duplex Doppler ultrasound was utilized to evaluate blood flow to the ovaries.  Comparison: CT abdomen and pelvis 06/12/2013  FINDINGS  Uterus: The uterus is anteverted and measures 7.3 x 2.7 x 5.9 cm. No focal myometrial mass lesions. Cervix appears unremarkable.  Endometrium: Normal endometrial stripe thickness measured at 2.1 mm. No abnormal endometrial fluid collections.  Right ovary: The right ovary measures 2.5 x 3 x 2.1 cm. Normal follicular changes. Flow is demonstrated in the right ovary on color flow Doppler imaging. No abnormal adnexal masses.  Left ovary: Left ovary measures 3.1 x 1.1 x 2 cm. Normal follicular changes demonstrated. Flow is demonstrated in the left ovary on color flow Doppler imaging. No abnormal adnexal masses.  Pulsed Doppler evaluation demonstrates normal low-resistance arterial and venous waveforms in both ovaries.  Small moderate free fluid in the pelvis in the cul-de-sac and around the right ovary. No hydrosalpinx.  IMPRESSION:  Normal appearance of the uterus and ovaries. Moderate free fluid in the pelvis.  No sonographic evidence for ovarian torsion.   Original Report Authenticated By: Burman Nieves, M.D.         Labs Reviewed  CBC - Abnormal; Notable for the following:    WBC 16.9 (*)    Hemoglobin 11.4 (*)    HCT 32.9 (*)    All  other components within normal limits  COMPREHENSIVE METABOLIC PANEL - Abnormal; Notable for the following:    Calcium 7.9 (*)    Albumin 2.6 (*)    All other components within normal limits  URINALYSIS, ROUTINE W REFLEX MICROSCOPIC - Abnormal; Notable for the following:    APPearance HAZY (*)    Bilirubin Urine MODERATE (*)    Ketones, ur >80 (*)    Protein, ur 100 (*)    Leukocytes, UA TRACE (*)  All other components within normal limits  URINE MICROSCOPIC-ADD ON - Abnormal; Notable for the following:    Squamous Epithelial / LPF FEW (*)    Bacteria, UA FEW (*)    All other components within normal limits  URINE CULTURE  LIPASE, BLOOD   No results found. 1. Bacterial vaginosis     MDM  Pts infection persists but ultrasound does not show PID or any other concerning findings. Will give one time dose of Metronidazole in ED. Pt tolerated the medication well with Zofran. Pain under control, she tolerated fluid challenge.  Rx: Zofran and Tramadol.  17 y.o.Kesa Launer's evaluation in the Emergency Department is complete. It has been determined that no acute conditions requiring further emergency intervention are present at this time. The patient/guardian have been advised of the diagnosis and plan. We have discussed signs and symptoms that warrant return to the ED, such as changes or worsening in symptoms.  Vital signs are stable at discharge. Filed Vitals:   06/15/13 0052  BP: 106/62  Pulse: 98  Temp:   Resp: 16    Patient/guardian has voiced understanding and agreed to follow-up with the PCP or specialist.   Dorthula Matas, PA-C 06/18/13 1511

## 2013-06-16 LAB — URINE CULTURE

## 2013-06-18 NOTE — ED Provider Notes (Signed)
Medical screening examination/treatment/procedure(s) were performed by non-physician practitioner and as supervising physician I was immediately available for consultation/collaboration.  Karleen Seebeck B. Keimya Briddell, MD 06/18/13 2213 

## 2014-01-28 LAB — CBC WITH DIFFERENTIAL/PLATELET
BASOS PCT: 0.9 %
Basophil #: 0.2 10*3/uL — ABNORMAL HIGH (ref 0.0–0.1)
EOS ABS: 0.1 10*3/uL (ref 0.0–0.7)
Eosinophil %: 0.5 %
HCT: 41.2 % (ref 35.0–47.0)
HGB: 13.4 g/dL (ref 12.0–16.0)
LYMPHS PCT: 12.2 %
Lymphocyte #: 2.4 10*3/uL (ref 1.0–3.6)
MCH: 27.3 pg (ref 26.0–34.0)
MCHC: 32.4 g/dL (ref 32.0–36.0)
MCV: 84 fL (ref 80–100)
Monocyte #: 0.7 x10 3/mm (ref 0.2–0.9)
Monocyte %: 3.5 %
NEUTROS ABS: 16.2 10*3/uL — AB (ref 1.4–6.5)
Neutrophil %: 82.9 %
Platelet: 343 10*3/uL (ref 150–440)
RBC: 4.9 10*6/uL (ref 3.80–5.20)
RDW: 14.5 % (ref 11.5–14.5)
WBC: 19.5 10*3/uL — ABNORMAL HIGH (ref 3.6–11.0)

## 2014-01-28 LAB — URINALYSIS, COMPLETE
Bilirubin,UR: NEGATIVE
Blood: NEGATIVE
Glucose,UR: NEGATIVE mg/dL (ref 0–75)
Nitrite: NEGATIVE
PH: 6 (ref 4.5–8.0)
Protein: >=500
RBC,UR: 1 /HPF (ref 0–5)
Specific Gravity: 1.027 (ref 1.003–1.030)
Squamous Epithelial: 8
WBC UR: 5 /HPF (ref 0–5)

## 2014-01-28 LAB — COMPREHENSIVE METABOLIC PANEL
ALT: 18 U/L (ref 12–78)
Albumin: 3.6 g/dL — ABNORMAL LOW (ref 3.8–5.6)
Alkaline Phosphatase: 74 U/L
Anion Gap: 4 — ABNORMAL LOW (ref 7–16)
BUN: 8 mg/dL (ref 7–18)
Bilirubin,Total: 0.4 mg/dL (ref 0.2–1.0)
CO2: 22 mmol/L (ref 21–32)
Calcium, Total: 8.7 mg/dL — ABNORMAL LOW (ref 9.0–10.7)
Chloride: 108 mmol/L — ABNORMAL HIGH (ref 98–107)
Creatinine: 0.81 mg/dL (ref 0.60–1.30)
EGFR (African American): >60
EGFR (Non-African Amer.): >60
Glucose: 100 mg/dL — ABNORMAL HIGH (ref 65–99)
OSMOLALITY: 267 (ref 275–301)
Potassium: 3.2 mmol/L — ABNORMAL LOW (ref 3.5–5.1)
SGOT(AST): 21 U/L (ref 0–26)
Sodium: 134 mmol/L — ABNORMAL LOW (ref 136–145)
Total Protein: 7.7 g/dL (ref 6.4–8.6)

## 2014-01-28 LAB — WET PREP, GENITAL

## 2014-01-28 LAB — GC/CHLAMYDIA PROBE AMP

## 2014-01-29 ENCOUNTER — Ambulatory Visit: Payer: Self-pay | Admitting: Surgery

## 2014-01-31 LAB — PATHOLOGY REPORT

## 2014-02-03 ENCOUNTER — Emergency Department: Payer: Self-pay | Admitting: Emergency Medicine

## 2014-02-03 LAB — CBC WITH DIFFERENTIAL/PLATELET
BASOS PCT: 1.1 %
Basophil #: 0.1 10*3/uL (ref 0.0–0.1)
Eosinophil #: 0.2 10*3/uL (ref 0.0–0.7)
Eosinophil %: 2.4 %
HCT: 41.9 % (ref 35.0–47.0)
HGB: 13.5 g/dL (ref 12.0–16.0)
Lymphocyte #: 2.1 10*3/uL (ref 1.0–3.6)
Lymphocyte %: 27.4 %
MCH: 27.3 pg (ref 26.0–34.0)
MCHC: 32.2 g/dL (ref 32.0–36.0)
MCV: 85 fL (ref 80–100)
MONO ABS: 0.5 x10 3/mm (ref 0.2–0.9)
Monocyte %: 6.1 %
NEUTROS ABS: 4.7 10*3/uL (ref 1.4–6.5)
NEUTROS PCT: 63 %
Platelet: 324 10*3/uL (ref 150–440)
RBC: 4.94 10*6/uL (ref 3.80–5.20)
RDW: 14.6 % — AB (ref 11.5–14.5)
WBC: 7.5 10*3/uL (ref 3.6–11.0)

## 2014-02-03 LAB — BASIC METABOLIC PANEL
Anion Gap: 4 — ABNORMAL LOW (ref 7–16)
BUN: 7 mg/dL (ref 7–18)
CALCIUM: 9.1 mg/dL (ref 9.0–10.7)
CHLORIDE: 105 mmol/L (ref 98–107)
Co2: 27 mmol/L (ref 21–32)
Creatinine: 0.86 mg/dL (ref 0.60–1.30)
EGFR (African American): >60
Glucose: 111 mg/dL — ABNORMAL HIGH (ref 65–99)
OSMOLALITY: 271 (ref 275–301)
Potassium: 3.7 mmol/L (ref 3.5–5.1)
SODIUM: 136 mmol/L (ref 136–145)

## 2014-02-03 LAB — URINALYSIS, COMPLETE
BACTERIA: NONE SEEN
BILIRUBIN, UR: NEGATIVE
Glucose,UR: NEGATIVE mg/dL (ref 0–75)
Ketone: NEGATIVE
Nitrite: NEGATIVE
PH: 6 (ref 4.5–8.0)
RBC,UR: 2 /HPF (ref 0–5)
Specific Gravity: 1.024 (ref 1.003–1.030)
Squamous Epithelial: 11
WBC UR: 3 /HPF (ref 0–5)

## 2014-09-23 ENCOUNTER — Encounter (HOSPITAL_COMMUNITY): Payer: Self-pay | Admitting: Emergency Medicine

## 2014-10-27 ENCOUNTER — Emergency Department (HOSPITAL_COMMUNITY): Payer: BC Managed Care – PPO

## 2014-10-27 ENCOUNTER — Emergency Department (HOSPITAL_COMMUNITY)
Admission: EM | Admit: 2014-10-27 | Discharge: 2014-10-28 | Disposition: A | Discharge status: Home / Self Care | Payer: BC Managed Care – PPO | Attending: Emergency Medicine | Admitting: Emergency Medicine

## 2014-10-27 ENCOUNTER — Encounter (HOSPITAL_COMMUNITY): Payer: Self-pay | Admitting: *Deleted

## 2014-10-27 DIAGNOSIS — R109 Unspecified abdominal pain: Secondary | ICD-10-CM

## 2014-10-27 DIAGNOSIS — N189 Chronic kidney disease, unspecified: Secondary | ICD-10-CM | POA: Insufficient documentation

## 2014-10-27 DIAGNOSIS — Z9089 Acquired absence of other organs: Secondary | ICD-10-CM | POA: Diagnosis not present

## 2014-10-27 DIAGNOSIS — Z87442 Personal history of urinary calculi: Secondary | ICD-10-CM | POA: Insufficient documentation

## 2014-10-27 DIAGNOSIS — Z79899 Other long term (current) drug therapy: Secondary | ICD-10-CM | POA: Diagnosis not present

## 2014-10-27 DIAGNOSIS — Z9889 Other specified postprocedural states: Secondary | ICD-10-CM | POA: Diagnosis not present

## 2014-10-27 DIAGNOSIS — K922 Gastrointestinal hemorrhage, unspecified: Secondary | ICD-10-CM | POA: Diagnosis present

## 2014-10-27 DIAGNOSIS — Z720 Tobacco use: Secondary | ICD-10-CM | POA: Insufficient documentation

## 2014-10-27 HISTORY — DX: Eclampsia complicating pregnancy, unspecified trimester: O15.00

## 2014-10-27 LAB — COMPREHENSIVE METABOLIC PANEL
ALBUMIN: 3.4 g/dL — AB (ref 3.5–5.2)
ALK PHOS: 77 U/L (ref 39–117)
ALT: 12 U/L (ref 0–35)
ANION GAP: 12 (ref 5–15)
AST: 17 U/L (ref 0–37)
BUN: 15 mg/dL (ref 6–23)
CALCIUM: 8.9 mg/dL (ref 8.4–10.5)
CO2: 23 mEq/L (ref 19–32)
CREATININE: 0.87 mg/dL (ref 0.50–1.10)
Chloride: 103 mEq/L (ref 96–112)
GFR calc non Af Amer: >90 mL/min (ref >90)
Glucose, Bld: 72 mg/dL (ref 70–99)
POTASSIUM: 3.7 meq/L (ref 3.7–5.3)
Sodium: 138 mEq/L (ref 137–147)
TOTAL PROTEIN: 6.7 g/dL (ref 6.0–8.3)
Total Bilirubin: <0.2 mg/dL — ABNORMAL LOW (ref 0.3–1.2)

## 2014-10-27 LAB — CBC WITH DIFFERENTIAL/PLATELET
BASOS PCT: 0 % (ref 0–1)
Basophils Absolute: 0.1 10*3/uL (ref 0.0–0.1)
EOS ABS: 0.1 10*3/uL (ref 0.0–0.7)
EOS PCT: 1 % (ref 0–5)
HCT: 34.3 % — ABNORMAL LOW (ref 36.0–46.0)
HEMOGLOBIN: 12 g/dL (ref 12.0–15.0)
Lymphocytes Relative: 19 % (ref 12–46)
Lymphs Abs: 2.1 10*3/uL (ref 0.7–4.0)
MCH: 29.6 pg (ref 26.0–34.0)
MCHC: 35 g/dL (ref 30.0–36.0)
MCV: 84.7 fL (ref 78.0–100.0)
MONOS PCT: 6 % (ref 3–12)
Monocytes Absolute: 0.7 10*3/uL (ref 0.1–1.0)
NEUTROS PCT: 74 % (ref 43–77)
Neutro Abs: 8.2 10*3/uL — ABNORMAL HIGH (ref 1.7–7.7)
Platelets: 286 10*3/uL (ref 150–400)
RBC: 4.05 MIL/uL (ref 3.87–5.11)
RDW: 13.2 % (ref 11.5–15.5)
WBC: 11.2 10*3/uL — ABNORMAL HIGH (ref 4.0–10.5)

## 2014-10-27 LAB — URINALYSIS, ROUTINE W REFLEX MICROSCOPIC
BILIRUBIN URINE: NEGATIVE
Glucose, UA: NEGATIVE mg/dL
HGB URINE DIPSTICK: NEGATIVE
KETONES UR: NEGATIVE mg/dL
Leukocytes, UA: NEGATIVE
Nitrite: NEGATIVE
PROTEIN: NEGATIVE mg/dL
Specific Gravity, Urine: 1.018 (ref 1.005–1.030)
Urobilinogen, UA: 0.2 mg/dL (ref 0.0–1.0)
pH: 6 (ref 5.0–8.0)

## 2014-10-27 LAB — I-STAT BETA HCG BLOOD, ED (MC, WL, AP ONLY)

## 2014-10-27 LAB — PROTIME-INR
INR: 1.08 (ref 0.00–1.49)
PROTHROMBIN TIME: 14.1 s (ref 11.6–15.2)

## 2014-10-27 LAB — POC OCCULT BLOOD, ED: Fecal Occult Bld: POSITIVE — AB

## 2014-10-27 LAB — TYPE AND SCREEN
ABO/RH(D): O POS
ANTIBODY SCREEN: NEGATIVE

## 2014-10-27 MED ORDER — SODIUM CHLORIDE 0.9 % IV BOLUS (SEPSIS)
1000.0000 mL | Freq: Once | INTRAVENOUS | Status: AC
  Administered 2014-10-27: 1000 mL via INTRAVENOUS

## 2014-10-27 MED ORDER — IOHEXOL 300 MG/ML  SOLN
100.0000 mL | Freq: Once | INTRAMUSCULAR | Status: AC | PRN
  Administered 2014-10-27: 100 mL via INTRAVENOUS

## 2014-10-27 NOTE — ED Notes (Signed)
Pt c/o RLQ pain starting prior to arrival, associated with bright red bloody diarrhea. Pt initially hypotensive for EMS; pt received bolus of fluids. Pt denies NV

## 2014-10-27 NOTE — ED Notes (Signed)
CT aware pt ready for transport.  

## 2014-10-27 NOTE — ED Provider Notes (Signed)
CSN: [id number]     Arrival date & time 10/27/14  1953 History   First MD Initiated Contact with Patient 10/27/14 2011     Chief Complaint  Patient presents with  . GI Bleeding     (Consider location/radiation/quality/duration/timing/severity/associated sxs/prior Treatment) HPI   Patient with PMH of seizures x 2 with no prior history, chronic kidney disease, and ecc;lampsia during pregnancy presents to the ED with complaints of large painless bloody diarrhea bowel movement just prior to arrival. She describes it as bright red and her significant other described it as dark red. Per EMS it was not an impressive amount of blood. She denies having a history of the same. No fevers, nausea, vomiting, weakness, vaginal bleeding, dysuria, rash, sore throat, headache, neck pain. Per EMS her BP was low at 80/40, they gave her a 500 ml bolus and she is now 106/69. She denies actively bleeding or having blood in her underwear. She denies feeling fatigued.   Past Medical History  Diagnosis Date  . Seizures 10-21-2012    x 2 ; No prior history  . Chronic kidney disease     Frequent kidney stones; stent placed 2012  . Eclampsia during pregnancy    Past Surgical History  Procedure Laterality Date  . Cystoscopy with stent placement  2012  . Cesarean section  10/22/2012    Procedure: CESAREAN SECTION;  Surgeon: Levi Aland, MD;  Location: [facility];  Service: Obstetrics;  Laterality: N/A;   Family History  Problem Relation Age of Onset  . Other Neg Hx   . Hyperlipidemia Mother   . Coronary artery disease Mother 43    S/P CABG   History  Substance Use Topics  . Smoking status: Current Every Day Smoker  . Smokeless tobacco: Never Used  . Alcohol Use: Yes   OB History    Gravida Para Term Preterm AB TAB SAB Ectopic Multiple Living   1 1 1  0 0 0 0 0 0 1     Review of Systems  10 Systems reviewed and are negative for acute change except as noted in the HPI.     Allergies  Review of  patient's allergies indicates no known allergies.  Home Medications   Prior to Admission medications   Medication Sig Start Date End Date Taking? Authorizing Provider  Aspirin-Salicylamide-Caffeine (BC FAST PAIN RELIEF) [phone] MG PACK Take 1 Package by mouth as needed (for pain).   Yes Historical Provider, MD  bisacodyl (DULCOLAX) 5 MG EC tablet Take 1 tablet (5 mg total) by mouth 2 (two) times daily. 06/15/13   Shalondra Wunschel Irine Seal, PA-C  ibuprofen (ADVIL,MOTRIN) 200 MG tablet Take 400 mg by mouth every 8 (eight) hours as needed for moderate pain.    Yes Historical Provider, MD  ondansetron (ZOFRAN ODT) 4 MG disintegrating tablet Take 1 tablet (4 mg total) by mouth every 8 (eight) hours as needed for nausea. 06/15/13   Dorthula Matas, PA-C  oxyCODONE-acetaminophen (PERCOCET/ROXICET) 5-325 MG per tablet Take 1 tablet by mouth every 4 (four) hours as needed for pain. 06/12/13   Carleene Cooper, MD  traMADol (ULTRAM) 50 MG tablet Take 1 tablet (50 mg total) by mouth every 6 (six) hours as needed for pain. 06/15/13   Aava Deland Irine Seal, PA-C   BP 105/58 mmHg  Pulse 100  Temp(Src) 98.3 F (36.8 C) (Oral)  Resp 19  SpO2 100%  LMP 10/09/2014 Physical Exam  Constitutional: She appears well-developed and well-nourished. She does not have a sickly  appearance. She does not appear ill. No distress.  HENT:  Head: Normocephalic and atraumatic.  Eyes: Pupils are equal, round, and reactive to light.  Neck: Normal range of motion. Neck supple.  Cardiovascular: Normal rate and regular rhythm.   Pulmonary/Chest: Effort normal.  Abdominal: Soft. Bowel sounds are normal. There is no tenderness. There is no rigidity, no rebound, no guarding and no CVA tenderness.  Genitourinary: Rectal exam shows no internal hemorrhoid, no fissure and no tenderness. Guaiac positive stool (small amount of BRB in rectum).  Neurological: She is alert.  Skin: Skin is warm and dry.  Nursing note and vitals reviewed.   ED Course   Procedures (including critical care time) Labs Review Labs Reviewed  CBC WITH DIFFERENTIAL - Abnormal; Notable for the following:    WBC 11.2 (*)    HCT 34.3 (*)    Neutro Abs 8.2 (*)    All other components within normal limits  COMPREHENSIVE METABOLIC PANEL - Abnormal; Notable for the following:    Albumin 3.4 (*)    Total Bilirubin <0.2 (*)    All other components within normal limits  URINALYSIS, ROUTINE W REFLEX MICROSCOPIC - Abnormal; Notable for the following:    APPearance CLOUDY (*)    All other components within normal limits  POC OCCULT BLOOD, ED - Abnormal; Notable for the following:    Fecal Occult Bld POSITIVE (*)    All other components within normal limits  PROTIME-INR  I-STAT BETA HCG BLOOD, ED ([facility], [facility], [facility] ONLY)  TYPE AND SCREEN  ABO/RH    Imaging Review Ct Abdomen Pelvis W Contrast  10/28/2014   CLINICAL DATA:  Rectal bleeding. One episode large amounts of bright red blood during bowel movement. Blood pressure 88/40. Pale and diaphoretic on admission. Right lower quadrant pain.  EXAM: CT ABDOMEN AND PELVIS WITH CONTRAST  TECHNIQUE: Multidetector CT imaging of the abdomen and pelvis was performed using the standard protocol following bolus administration of intravenous contrast.  CONTRAST:  100mL OMNIPAQUE IOHEXOL 300 MG/ML  SOLN  COMPARISON:  01/28/2014  FINDINGS: The lung bases are clear.  Liver, spleen, gallbladder, pancreas, adrenal glands, kidneys, abdominal aorta, inferior vena cava, and retroperitoneal lymph nodes are unremarkable. Stomach and small bowel are not abnormally distended and no focal wall thickening is appreciated. Scattered stool in the colon without abnormal distention or wall thickening appreciated. No free air or free fluid in the abdomen.  Pelvis: Small of free fluid in the pelvis is likely to be physiologic. Uterus and ovaries are not enlarged. No pelvic mass or lymphadenopathy. Appendix is surgically absent. No destructive bone lesions.   IMPRESSION: No acute process demonstrated in the abdomen or pelvis to account for gastrointestinal bleeding. Small amount of free fluid in the pelvis is likely physiologic.   Electronically Signed   By: Burman NievesWilliam  Stevens M.D.   On: 10/28/2014 00:02     EKG Interpretation None      MDM   Final diagnoses:  Abdominal pain  GI bleeding    Patients lab work returned without any acute abnormalities, hemodynamically stable. Normal PT/INR, CMP, urinalysis, neg hcg. He had low BP when EMS got to her but here it has been unremarkable. + fecal occult.  The patient on arrival denied having any pain or discomfort. Just prior to discharge, she informed that she is not having intermittent shooting pains to her belly that are happening every 5 minutes. She was also mildly tachycardic at 100 BMP. Will obtain CT scan of the abdomen  due to her being concerned of her belly pain.  Fortunately her CT abd/pelv w contrast is unremarkable.The patient is reassured by this. She will be given referral to GI. Her vital signs are stable. She continues to have no bleeding in the ED.  18 y.o.Adelina Ilyas's evaluation in the Emergency Department is complete. It has been determined that no acute conditions requiring further emergency intervention are present at this time. The patient/guardian have been advised of the diagnosis and plan. We have discussed signs and symptoms that warrant return to the ED, such as changes or worsening in symptoms.  Vital signs are stable at discharge. Filed Vitals:   10/27/14 2230  BP: 105/58  Pulse: 100  Temp:   Resp: 19    Patient/guardian has voiced understanding and agreed to follow-up with the PCP or specialist.     Dorthula Matasiffany G Makar Slatter, PA-C 10/28/14 [id number]  Toy CookeyMegan Docherty, MD 10/28/14 1302

## 2014-10-27 NOTE — ED Notes (Signed)
Pt to ED via [facility] from home c/o rectal bleeding. Pt reports going shopping today and felt fine. Once patient got home she had one episode of large amounts of bright red blood during bowel movement. EMS reports pt initial bp 80/40, pale and diaphoretic on arrival. Also c/o dull pain in RLQ. Pt A&Ox 4

## 2014-10-28 LAB — ABO/RH: ABO/RH(D): O POS

## 2014-10-28 NOTE — Discharge Instructions (Signed)
Gastrointestinal Bleeding Gastrointestinal (GI) bleeding means there is bleeding somewhere along the digestive tract, between the mouth and anus. CAUSES  There are many different problems that can cause GI bleeding. Possible causes include:  Esophagitis. This is inflammation, irritation, or swelling of the esophagus.  Hemorrhoids.These are veins that are full of blood (engorged) in the rectum. They cause pain, inflammation, and may bleed.  Anal fissures.These are areas of painful tearing which may bleed. They are often caused by passing hard stool.  Diverticulosis.These are pouches that form on the colon over time, with age, and may bleed significantly.  Diverticulitis.This is inflammation in areas with diverticulosis. It can cause pain, fever, and bloody stools, although bleeding is rare.  Polyps and cancer. Colon cancer often starts out as precancerous polyps.  Gastritis and ulcers.Bleeding from the upper gastrointestinal tract (near the stomach) may travel through the intestines and produce black, sometimes tarry, often bad smelling stools. In certain cases, if the bleeding is fast enough, the stools may not be black, but red. This condition may be life-threatening. SYMPTOMS   Vomiting bright red blood or material that looks like coffee grounds.  Bloody, black, or tarry stools. DIAGNOSIS  Your caregiver may diagnose your condition by taking your history and performing a physical exam. More tests may be needed, including:  X-rays and other imaging tests.  Esophagogastroduodenoscopy (EGD). This test uses a flexible, lighted tube to look at your esophagus, stomach, and small intestine.  Colonoscopy. This test uses a flexible, lighted tube to look at your colon. TREATMENT  Treatment depends on the cause of your bleeding.   For bleeding from the esophagus, stomach, small intestine, or colon, the caregiver doing your EGD or colonoscopy may be able to stop the bleeding as part of  the procedure.  Inflammation or infection of the colon can be treated with medicines.  Many rectal problems can be treated with creams, suppositories, or warm baths.  Surgery is sometimes needed.  Blood transfusions are sometimes needed if you have lost a lot of blood. If bleeding is slow, you may be allowed to go home. If there is a lot of bleeding, you will need to stay in the hospital for observation. HOME CARE INSTRUCTIONS   Take any medicines exactly as prescribed.  Keep your stools soft by eating foods that are high in fiber. These foods include whole grains, legumes, fruits, and vegetables. Prunes (1 to 3 a day) work well for many people.  Drink enough fluids to keep your urine clear or pale yellow. SEEK IMMEDIATE MEDICAL CARE IF:   Your bleeding increases.  You feel lightheaded, weak, or you faint.  You have severe cramps in your back or abdomen.  You pass large blood clots in your stool.  Your problems are getting worse. MAKE SURE YOU:   Understand these instructions.  Will watch your condition.  Will get help right away if you are not doing well or get worse. Document Released: 11/05/2000 Document Revised: 10/25/2012 Document Reviewed: 10/18/2011 [facility] Patient Information 2015 [place], [place]. This information is not intended to replace advice given to you by your health care provider. Make sure you discuss any questions you have with your health care provider.  Rectal Bleeding Rectal bleeding is when blood passes out of the anus. It is usually a sign that something is wrong. It may not be serious, but it should always be evaluated. Rectal bleeding may present as bright red blood or extremely dark stools. The color may range from dark red  or maroon to black (like tar). It is important that the cause of rectal bleeding be identified so treatment can be started and the problem corrected. CAUSES   Hemorrhoids. These are enlarged (dilated) blood vessels or veins in  the anal or rectal area.  Fistulas. Theseare abnormal, burrowing channels that usually run from inside the rectum to the skin around the anus. They can bleed.  Anal fissures. This is a tear in the tissue of the anus. Bleeding occurs with bowel movements.  Diverticulosis. This is a condition in which pockets or sacs project from the bowel wall. Occasionally, the sacs can bleed.  Diverticulitis. Thisis an infection involving diverticulosis of the colon.  Proctitis and colitis. These are conditions in which the rectum, colon, or both, can become inflamed and pitted (ulcerated).  Polyps and cancer. Polyps are non-cancerous (benign) growths in the colon that may bleed. Certain types of polyps turn into cancer.  Protrusion of the rectum. Part of the rectum can project from the anus and bleed.  Certain medicines.  Intestinal infections.  Blood vessel abnormalities. HOME CARE INSTRUCTIONS  Eat a high-fiber diet to keep your stool soft.  Limit activity.  Drink enough fluids to keep your urine clear or pale yellow.  Warm baths may be useful to soothe rectal pain.  Follow up with your caregiver as directed. SEEK IMMEDIATE MEDICAL CARE IF:  You develop increased bleeding.  You have black or dark red stools.  You vomit blood or material that looks like coffee grounds.  You have abdominal pain or tenderness.  You have a fever.  You feel weak, nauseous, or you faint.  You have severe rectal pain or you are unable to have a bowel movement. MAKE SURE YOU:  Understand these instructions.  Will watch your condition.  Will get help right away if you are not doing well or get worse. Document Released: 04/30/2002 Document Revised: 01/31/2012 Document Reviewed: 04/25/2011 [facility]tdExitCare Patient Information 2015 [place], [place]. This information is not intended to replace advice given to you by your health care provider. Make sure you discuss any questions you have with your health care  provider.

## 2014-10-29 ENCOUNTER — Emergency Department (HOSPITAL_COMMUNITY)
Admission: EM | Admit: 2014-10-29 | Discharge: 2014-10-29 | Disposition: A | Discharge status: Home / Self Care | Payer: BC Managed Care – PPO | Attending: Emergency Medicine | Admitting: Emergency Medicine

## 2014-10-29 ENCOUNTER — Encounter (HOSPITAL_COMMUNITY): Payer: Self-pay

## 2014-10-29 ENCOUNTER — Emergency Department (HOSPITAL_COMMUNITY): Payer: BC Managed Care – PPO

## 2014-10-29 DIAGNOSIS — Z87442 Personal history of urinary calculi: Secondary | ICD-10-CM | POA: Diagnosis not present

## 2014-10-29 DIAGNOSIS — R000 Tachycardia, unspecified: Secondary | ICD-10-CM | POA: Diagnosis not present

## 2014-10-29 DIAGNOSIS — Z3202 Encounter for pregnancy test, result negative: Secondary | ICD-10-CM | POA: Insufficient documentation

## 2014-10-29 DIAGNOSIS — Z720 Tobacco use: Secondary | ICD-10-CM | POA: Diagnosis not present

## 2014-10-29 DIAGNOSIS — D649 Anemia, unspecified: Secondary | ICD-10-CM | POA: Insufficient documentation

## 2014-10-29 DIAGNOSIS — R0602 Shortness of breath: Secondary | ICD-10-CM

## 2014-10-29 DIAGNOSIS — N189 Chronic kidney disease, unspecified: Secondary | ICD-10-CM | POA: Diagnosis not present

## 2014-10-29 DIAGNOSIS — Z79899 Other long term (current) drug therapy: Secondary | ICD-10-CM | POA: Insufficient documentation

## 2014-10-29 DIAGNOSIS — R42 Dizziness and giddiness: Secondary | ICD-10-CM | POA: Diagnosis present

## 2014-10-29 LAB — COMPREHENSIVE METABOLIC PANEL
ALK PHOS: 73 U/L (ref 39–117)
ALT: 12 U/L (ref 0–35)
ANION GAP: 11 (ref 5–15)
AST: 18 U/L (ref 0–37)
Albumin: 3.4 g/dL — ABNORMAL LOW (ref 3.5–5.2)
BUN: 10 mg/dL (ref 6–23)
CALCIUM: 8.9 mg/dL (ref 8.4–10.5)
CO2: 23 meq/L (ref 19–32)
Chloride: 104 mEq/L (ref 96–112)
Creatinine, Ser: 0.82 mg/dL (ref 0.50–1.10)
GLUCOSE: 77 mg/dL (ref 70–99)
POTASSIUM: 3.7 meq/L (ref 3.7–5.3)
Sodium: 138 mEq/L (ref 137–147)
TOTAL PROTEIN: 7.1 g/dL (ref 6.0–8.3)
Total Bilirubin: <0.2 mg/dL — ABNORMAL LOW (ref 0.3–1.2)

## 2014-10-29 LAB — URINALYSIS, ROUTINE W REFLEX MICROSCOPIC
BILIRUBIN URINE: NEGATIVE
Glucose, UA: NEGATIVE mg/dL
Hgb urine dipstick: NEGATIVE
Ketones, ur: NEGATIVE mg/dL
Leukocytes, UA: NEGATIVE
NITRITE: NEGATIVE
PROTEIN: NEGATIVE mg/dL
SPECIFIC GRAVITY, URINE: 1.023 (ref 1.005–1.030)
UROBILINOGEN UA: 0.2 mg/dL (ref 0.0–1.0)
pH: 6 (ref 5.0–8.0)

## 2014-10-29 LAB — CBC
HEMATOCRIT: 32.9 % — AB (ref 36.0–46.0)
HEMOGLOBIN: 11.1 g/dL — AB (ref 12.0–15.0)
MCH: 28.9 pg (ref 26.0–34.0)
MCHC: 33.7 g/dL (ref 30.0–36.0)
MCV: 85.7 fL (ref 78.0–100.0)
Platelets: 212 10*3/uL (ref 150–400)
RBC: 3.84 MIL/uL — ABNORMAL LOW (ref 3.87–5.11)
RDW: 13.3 % (ref 11.5–15.5)
WBC: 8.2 10*3/uL (ref 4.0–10.5)

## 2014-10-29 LAB — PREGNANCY, URINE: Preg Test, Ur: NEGATIVE

## 2014-10-29 LAB — D-DIMER, QUANTITATIVE (NOT AT ARMC): D DIMER QUANT: 0.28 ug{FEU}/mL (ref 0.00–0.48)

## 2014-10-29 MED ORDER — SODIUM CHLORIDE 0.9 % IV BOLUS (SEPSIS)
1000.0000 mL | Freq: Once | INTRAVENOUS | Status: AC
  Administered 2014-10-29: 1000 mL via INTRAVENOUS

## 2014-10-29 MED ORDER — FERROUS SULFATE 325 (65 FE) MG PO TABS: 325.0000 mg | ORAL_TABLET | Freq: Every day | ORAL | Status: DC

## 2014-10-29 NOTE — ED Notes (Addendum)
Patient would like to speak to MD before leaving. MD Linker aware.

## 2014-10-29 NOTE — ED Provider Notes (Signed)
CSN: [id number]     Arrival date & time 10/29/14  0940 History   First MD Initiated Contact with Patient 10/29/14 1001     Chief Complaint  Patient presents with  . Dizziness  . Shortness of Breath     (Consider location/radiation/quality/duration/timing/severity/associated sxs/prior Treatment) HPI  Pt presents with c/o dizziness upon standing and heart racing.  She states symptoms began last night.  No chest pain.  Also feels somewhat short of breath when she is dizzy.  No leg swelling, no fainting.  She was seen in the ED 2 days ago for concern of blood in her stool- CT abdomen at that time was reassuring.  She has not had further bowel movemements and has GI appointment scheduled for 3 days from today.  No hx of DVT/PE, no leg swelling, no recent travel/trauma/surgery.  There are no other associated systemic symptoms, there are no other alleviating or modifying factors.   Past Medical History  Diagnosis Date  . Seizures 10-21-2012    x 2 ; No prior history  . Chronic kidney disease     Frequent kidney stones; stent placed 2012  . Eclampsia during pregnancy    Past Surgical History  Procedure Laterality Date  . Cystoscopy with stent placement  2012  . Cesarean section  10/22/2012    Procedure: CESAREAN SECTION;  Surgeon: Levi AlandMark E Anderson, MD;  Location: [facility];  Service: Obstetrics;  Laterality: N/A;  . Appendectomy     Family History  Problem Relation Age of Onset  . Other Neg Hx   . Hyperlipidemia Mother   . Coronary artery disease Mother [id number]    S/P CABG   History  Substance Use Topics  . Smoking status: Current Every Day Smoker  . Smokeless tobacco: Never Used  . Alcohol Use: Yes   OB History    Gravida Para Term Preterm AB TAB SAB Ectopic Multiple Living   1 1 1  0 0 0 0 0 0 1     Review of Systems  ROS reviewed and all otherwise negative except for mentioned in HPI    Allergies  Review of patient's allergies indicates no known allergies.  Home Medications    Prior to Admission medications   Medication Sig Start Date End Date Taking? Authorizing Provider  Aspirin-Salicylamide-Caffeine (BC FAST PAIN RELIEF) [phone] MG PACK Take 1 Package by mouth as needed (for pain).   Yes Historical Provider, MD  ibuprofen (ADVIL,MOTRIN) 200 MG tablet Take 400 mg by mouth every 8 (eight) hours as needed for moderate pain.    Yes Historical Provider, MD  bisacodyl (DULCOLAX) 5 MG EC tablet Take 1 tablet (5 mg total) by mouth 2 (two) times daily. Patient not taking: Reported on 10/29/2014 06/15/13   Dorthula Matasiffany G Greene, PA-C  ferrous sulfate 325 (65 FE) MG tablet Take 1 tablet (325 mg total) by mouth daily. 10/29/14   Ethelda ChickMartha K Linker, MD  ondansetron (ZOFRAN ODT) 4 MG disintegrating tablet Take 1 tablet (4 mg total) by mouth every 8 (eight) hours as needed for nausea. Patient not taking: Reported on 10/29/2014 06/15/13   Dorthula Matasiffany G Greene, PA-C  oxyCODONE-acetaminophen (PERCOCET/ROXICET) 5-325 MG per tablet Take 1 tablet by mouth every 4 (four) hours as needed for pain. Patient not taking: Reported on 10/29/2014 06/12/13   Carleene CooperAlan Davidson, MD  traMADol (ULTRAM) 50 MG tablet Take 1 tablet (50 mg total) by mouth every 6 (six) hours as needed for pain. Patient not taking: Reported on 10/29/2014 06/15/13   Tiffany  Irine SealG Greene, PA-C   BP 107/75 mmHg  Pulse 86  Temp(Src) 98.2 F (36.8 C) (Oral)  Resp 20  SpO2 99%  LMP 10/09/2014  Vitals reviewed Physical Exam  Physical Examination: General appearance - alert, well appearing, and in no distress Mental status - alert, oriented to person, place, and time Eyes - no conjunctival injection, no scleral icterus Mouth - mucous membranes moist, pharynx normal without lesions Chest - clear to auscultation, no wheezes, rales or rhonchi, symmetric air entry Heart - normal rate, regular rhythm, normal S1, S2, no murmurs, rubs, clicks or gallops Abdomen - soft, nontender, nondistended, no masses or organomegaly Extremities - peripheral  pulses normal, no pedal edema, no clubbing or cyanosis Skin - normal coloration and turgor, no rashes  ED Course  Procedures (including critical care time) Labs Review Labs Reviewed  CBC - Abnormal; Notable for the following:    RBC 3.84 (*)    Hemoglobin 11.1 (*)    HCT 32.9 (*)    All other components within normal limits  COMPREHENSIVE METABOLIC PANEL - Abnormal; Notable for the following:    Albumin 3.4 (*)    Total Bilirubin <0.2 (*)    All other components within normal limits  URINALYSIS, ROUTINE W REFLEX MICROSCOPIC - Abnormal; Notable for the following:    APPearance CLOUDY (*)    All other components within normal limits  PREGNANCY, URINE  D-DIMER, QUANTITATIVE    Imaging Review Dg Chest 2 View  10/29/2014   CLINICAL DATA:  GI bleeding.  EXAM: CHEST  2 VIEW  COMPARISON:  None.  FINDINGS: The heart size and mediastinal contours are within normal limits. Both lungs are clear. Oral contrast noted in the colon. The visualized skeletal structures are unremarkable.  IMPRESSION: No active cardiopulmonary disease.   Electronically Signed   By: Maisie Fushomas  Register   On: 10/29/2014 11:44   Ct Abdomen Pelvis W Contrast  10/28/2014   CLINICAL DATA:  Rectal bleeding. One episode large amounts of bright red blood during bowel movement. Blood pressure 88/40. Pale and diaphoretic on admission. Right lower quadrant pain.  EXAM: CT ABDOMEN AND PELVIS WITH CONTRAST  TECHNIQUE: Multidetector CT imaging of the abdomen and pelvis was performed using the standard protocol following bolus administration of intravenous contrast.  CONTRAST:  100mL OMNIPAQUE IOHEXOL 300 MG/ML  SOLN  COMPARISON:  01/28/2014  FINDINGS: The lung bases are clear.  Liver, spleen, gallbladder, pancreas, adrenal glands, kidneys, abdominal aorta, inferior vena cava, and retroperitoneal lymph nodes are unremarkable. Stomach and small bowel are not abnormally distended and no focal wall thickening is appreciated. Scattered stool in  the colon without abnormal distention or wall thickening appreciated. No free air or free fluid in the abdomen.  Pelvis: Small of free fluid in the pelvis is likely to be physiologic. Uterus and ovaries are not enlarged. No pelvic mass or lymphadenopathy. Appendix is surgically absent. No destructive bone lesions.  IMPRESSION: No acute process demonstrated in the abdomen or pelvis to account for gastrointestinal bleeding. Small amount of free fluid in the pelvis is likely physiologic.   Electronically Signed   By: Burman NievesWilliam  Stevens M.D.   On: 10/28/2014 00:02     EKG Interpretation   Date/Time:  Tuesday October 29 2014 09:54:49 EST Ventricular Rate:  102 PR Interval:  164 QRS Duration: 89 QT Interval:  331 QTC Calculation: 431 R Axis:   59 Text Interpretation:  Sinus tachycardia RSR' in V1 or V2, probably normal  variant Baseline wander in lead(s) V4  No significant change since last  tracing Confirmed by [facility]  MD, Darren Nodal [phone]) on 10/29/2014 12:35:24 PM      MDM   Final diagnoses:  Shortness of breath  Anemia, unspecified anemia type  Tachycardia    Pt presenting with shortness of breath and tachycardia, EKG shows sinus tach.  Pt not orthostatic.  Mild anemia which is not significantly different from prior- advised starting on iron supplements.  HR improved after IV fluids.  Pt has GI followup scheduled for later this week.  All results d/w patient.  Discharged with strict return precautions.  Pt agreeable with plan.    Ethelda Chick, MD 10/29/14 [phone]

## 2014-10-29 NOTE — ED Notes (Signed)
MD Linker at bedside to speak with patient.

## 2014-10-29 NOTE — Discharge Instructions (Signed)
Return to the ED with any concerns including difficulty breathing, chest pain, fainting, vomiting and not able to keep down liquids, decreased level of alertness/lethargy, or any other alarming symptoms   Emergency Department Resource Guide 1) Find a Doctor and Pay Out of Pocket Although you won't have to find out who is covered by your insurance plan, it is a good idea to ask around and get recommendations. You will then need to call the office and see if the doctor you have chosen will accept you as a new patient and what types of options they offer for patients who are self-pay. Some doctors offer discounts or will set up payment plans for their patients who do not have insurance, but you will need to ask so you aren't surprised when you get to your appointment.  2) Contact Your Local Health Department Not all health departments have doctors that can see patients for sick visits, but many do, so it is worth a call to see if yours does. If you don't know where your local health department is, you can check in your phone book. The CDC also has a tool to help you locate your state's health department, and many state websites also have listings of all of their local health departments.  3) Find a Walk-in Clinic If your illness is not likely to be very severe or complicated, you may want to try a walk in clinic. These are popping up all over the country in pharmacies, drugstores, and shopping centers. They're usually staffed by nurse practitioners or physician assistants that have been trained to treat common illnesses and complaints. They're usually fairly quick and inexpensive. However, if you have serious medical issues or chronic medical problems, these are probably not your best option.  No Primary Care Doctor: - Call Health Connect at  [phone] - they can help you locate a primary care doctor that  accepts your insurance, provides certain services, etc. - Physician Referral Service-  [phone]  Chronic Pain Problems: Organization         Address  Phone   Notes  [facility]  [phone] Patients need to be referred by their primary care doctor.   Medication Assistance: Organization         Address  Phone   Notes  [facility] [address] [place], [place] [address] [phone] --Must be a resident of [facility] -- Must have NO insurance coverage whatsoever (no Medicaid/ Medicare, etc.) -- The pt. MUST have a primary care doctor that directs their care regularly and follows them in the community   MedAssist  [phone]   [employer]  [phone]    Agencies that provide inexpensive medical care: Organization         Address  Phone   Notes  [facility]  [phone]   [facility]    [phone]   [facility] [address] [place], [place] [address] [phone]   [facility] [address], [place] [phone]   Planned Parenthood    [phone]   [facility]    [phone]   [facility] and [facility]  [address], [place] Phone:  [phone], Fax:  [phone] Hours of Operation:  9 am - 6 pm, M-F.  Also accepts Medicaid/Medicare and self-pay.  [facility]  [address], [place] Phone: [phone], Fax: [phone]. Hours of Operation:  8:30 am - 5:30 pm, M-F.  Also accepts Medicaid and self-pay.  [facility] [address] [place] Phone: [phone]   [place], [place], [place] [phone], Ext. [phone] Mondays & Thursdays: 7-9 AM.  First 15 patients are seen on a first come, first serve basis.    [place] Providers:  Organization         Address  Phone   Notes  [facility] [address],  [place] [phone] Also accepts self-pay patients.  [facility] [address], [place]  [phone]   [place], [address], [place] [phone]   [facility] [address] [place] [phone]   Lucianne Lei [address], [place]   [phone] Only accepts [place] Access [place] patients after they have their name applied to their card.   Self-Pay (no insurance) in [facility]:  Organization         Address  Phone   Notes  Sickle Cell Patients, [facility] [place] [phone]   [facility] Urgent Care [place] [phone]   [facility] [place]  [place], [place], [place] [phone]   [facility]/Dr. Osei-Bonsu  [address] [place] or [address], [place] [phone] Phone number for both [place] and [place] locations is the same.  Urgent Medical and [facility] [address], [place] [phone]   [facility] [address] [place] or [address] [phone]   [facility] [address] [place] [phone], phone; [phone], fax Sees patients 1st and 3rd Saturday of every month.  Must not qualify for public or private insurance (i.e. Medicaid, Medicare, [place] Health Choice, Veterans' Benefits)  Household income should be no more than 200% of the poverty level The clinic cannot treat you if you are pregnant or think you are pregnant  Sexually transmitted diseases are not treated at the clinic.    Dental Care: Organization         Address  Phone  Notes  [facility] Department of [facility] [phone] Accepts children up to age 11 who are enrolled in  [place] or [place]; pregnant women with a Medicaid card; and children who have applied for Medicaid or [place] Health Choice, but were declined, whose parents can pay a reduced fee at time of service.  [facility] Department of [facility]  [address], [place] [phone] Accepts children up to age 74 who are enrolled in [place] or [place]; pregnant women with a Medicaid card; and children who have applied for Medicaid or [place] Health Choice, but were declined, whose parents can pay a reduced fee at time of service.  [facility] Access PROGRAM  [place] [phone] Patients are seen by appointment only. Walk-ins are not accepted. [place] will see patients 38 years of age and older. Monday - Tuesday (8am-5pm) Most Wednesdays (8:30-5pm) $30 per visit, cash only  [place]  PROGRAM  [address], [facility] [phone] Patients are seen by appointment only. Walk-ins are not accepted. [place] will see patients 72 years of age and older. One Wednesday Evening (Monthly: Volunteer Based).  $30 per visit, cash only  [place]  [phone] for adults; Children under age 63, call Graduate Pediatric Dentistry at [phone]. Children aged 75-14, please call [phone] to request a pediatric application.  Dental services are provided in all areas of dental care including fillings, crowns and bridges, complete and partial dentures, implants, gum treatment, root canals, and extractions. Preventive care is also provided. Treatment is provided to both adults and children. Patients are selected via a lottery and there is often a waiting list.   [facility] [address] [place]  [phone] [email or web address]   [facility] [address], [place] [phone], Ext. [phone] Second and Fourth Thursday of each month, opens at 6:30  AM; Clinic ends at 9 AM.  Patients are seen on a first-come first-served basis, and a limited number are seen during each clinic.   [facility]  [address] Hillard Danker [place], [place] [phone]   Eligibility Requirements You must have lived in [place], [place], or [place] counties for at least the last three months.   You cannot be eligible for state or federal sponsored [employer], including [employer], [place], or [employer].   You generally cannot be eligible for healthcare insurance through your employer.    How to apply: Eligibility screenings are held every Tuesday and Wednesday afternoon from 1:00 pm until 4:00 pm. You do not need an appointment for the interview!  [facility] [address] [place], [place]   [place]  [place] [facility] Department  [place]  [phone]    Behavioral Health Resources in the Community: Intensive Outpatient Programs Organization         Address  Phone  Notes  Hemet Warren. [address], [place], [place] [phone]   [facility] Outpatient [address], [place], [place]   ADS: Alcohol & Drug Svcs [address] [place], [place]   [place] [address]  [place], [place] or [phone]   Substance Abuse Resources Organization         Address  Phone  Notes  Alcohol and Drug Services  [phone]   [place]  [phone]   The [place]   Chinita Pester  [phone]   Residential & Outpatient Substance Abuse Program  [phone]   Psychological Services Organization         Address  Phone  Notes  [facility] [place]  [place]  [phone]   [place] [address], [place] or  [phone]    Mobile Crisis Teams Organization         Address  Phone  Notes  [facility], Mobile Crisis Care Unit  [phone]   [facility]ive Psychotherapeutic Services  [address] [place], [place]   [place] [address], [place] [phone]    Self-Help/Support Groups Organization         Address  Phone             Notes  [place].  of [place] - variety of support groups  [place] Call for more information  Narcotics Anonymous (NA), [facility] [address], [employer] [place]  2 meetings at this location   Special educational needs teacher         Address  Phone  Notes  ASAP Residential Treatment [place],    [place]  [phone]   [facility]  [address] [place] [address], [place], [place]   [place], [place] [phone] Admissions: 8am-3pm M-F  Incentives Substance [place] [address],    [place], [place] [phone]   [facility] [address] [place], [place], [place]   [facility] [address],  [place], [place]   Insight Programs - Intensive Outpatient [address], [place], [place]   [facility] [place].,  [place], [place] [phone] or [phone]   [facility]idential [facility] Services (RTS) [address] [place], [place] Accepts Medicaid  [facility] [place] [address]  [place] [phone] Substance Abuse/Addiction Treatment   [facility] Organization         Address  Phone  Notes  [facility]  [phone]   Domenic Schwab, PhD [address] Arlis Porta [place], [place]   [phone] or [phone]   [place], [place] [phone]   [facility] [address],  [place], [place] [phone] Insurance/Medicaid/sponsorship through [facility] and Families [address] [place]                                    [place], [place] [phone] [place] [address], [place] [phone]    Dr. Adele Schilder  [phone]   [facility] Dept. 1) [address], [place] 2) [place] 3)  [place] [address], [place] [phone]  [phone]   [place] [phone] or [phone] (After Hours)

## 2014-10-29 NOTE — ED Notes (Addendum)
Pt c/o intermittent dizziness w/ movement, SOB w/ activity, and "racing heart" since last night and intermittent, R side abdominal pain x 2 days.  Pain score 3/10.  [facility] for [facility] bleeding and sts all testing was negative.  Pt easily speaking in full sentences.  Last BM x 2 days ago.  Pt has an appointment w/ GI MD on Friday, 12/11.

## 2014-10-29 NOTE — ED Notes (Signed)
Patient transported to X-ray 

## 2015-01-27 ENCOUNTER — Inpatient Hospital Stay (HOSPITAL_COMMUNITY)
Admission: AD | Admit: 2015-01-27 | Discharge: 2015-01-27 | Disposition: A | Discharge status: Home / Self Care | Payer: Medicaid Other | Source: Ambulatory Visit | Attending: Obstetrics & Gynecology | Admitting: Obstetrics & Gynecology

## 2015-01-27 ENCOUNTER — Inpatient Hospital Stay (HOSPITAL_COMMUNITY): Payer: Medicaid Other

## 2015-01-27 ENCOUNTER — Encounter (HOSPITAL_COMMUNITY): Payer: Self-pay | Admitting: General Practice

## 2015-01-27 DIAGNOSIS — O4691 Antepartum hemorrhage, unspecified, first trimester: Secondary | ICD-10-CM | POA: Diagnosis not present

## 2015-01-27 DIAGNOSIS — O418X10 Other specified disorders of amniotic fluid and membranes, first trimester, not applicable or unspecified: Secondary | ICD-10-CM

## 2015-01-27 DIAGNOSIS — O209 Hemorrhage in early pregnancy, unspecified: Secondary | ICD-10-CM | POA: Diagnosis present

## 2015-01-27 DIAGNOSIS — O26831 Pregnancy related renal disease, first trimester: Secondary | ICD-10-CM | POA: Insufficient documentation

## 2015-01-27 DIAGNOSIS — Z3A01 Less than 8 weeks gestation of pregnancy: Secondary | ICD-10-CM | POA: Insufficient documentation

## 2015-01-27 DIAGNOSIS — O30001 Twin pregnancy, unspecified number of placenta and unspecified number of amniotic sacs, first trimester: Secondary | ICD-10-CM

## 2015-01-27 DIAGNOSIS — N189 Chronic kidney disease, unspecified: Secondary | ICD-10-CM | POA: Insufficient documentation

## 2015-01-27 DIAGNOSIS — Z87891 Personal history of nicotine dependence: Secondary | ICD-10-CM | POA: Insufficient documentation

## 2015-01-27 DIAGNOSIS — O30031 Twin pregnancy, monochorionic/diamniotic, first trimester: Secondary | ICD-10-CM

## 2015-01-27 DIAGNOSIS — O468X1 Other antepartum hemorrhage, first trimester: Secondary | ICD-10-CM

## 2015-01-27 LAB — URINE MICROSCOPIC-ADD ON

## 2015-01-27 LAB — URINALYSIS, ROUTINE W REFLEX MICROSCOPIC
Bilirubin Urine: NEGATIVE
Glucose, UA: NEGATIVE mg/dL
Ketones, ur: NEGATIVE mg/dL
Leukocytes, UA: NEGATIVE
Nitrite: NEGATIVE
PROTEIN: NEGATIVE mg/dL
Specific Gravity, Urine: 1.025 (ref 1.005–1.030)
UROBILINOGEN UA: 1 mg/dL (ref 0.0–1.0)
pH: 6 (ref 5.0–8.0)

## 2015-01-27 LAB — CBC
HCT: 35.9 % — ABNORMAL LOW (ref 36.0–46.0)
Hemoglobin: 12 g/dL (ref 12.0–15.0)
MCH: 26.1 pg (ref 26.0–34.0)
MCHC: 33.4 g/dL (ref 30.0–36.0)
MCV: 78.2 fL (ref 78.0–100.0)
Platelets: 312 10*3/uL (ref 150–400)
RBC: 4.59 MIL/uL (ref 3.87–5.11)
RDW: 14.4 % (ref 11.5–15.5)
WBC: 12.4 10*3/uL — ABNORMAL HIGH (ref 4.0–10.5)

## 2015-01-27 LAB — ABO/RH: ABO/RH(D): O POS

## 2015-01-27 LAB — POCT PREGNANCY, URINE: Preg Test, Ur: POSITIVE — AB

## 2015-01-27 LAB — HCG, QUANTITATIVE, PREGNANCY: HCG, BETA CHAIN, QUANT, S: 156701 m[IU]/mL — AB (ref <5)

## 2015-01-27 NOTE — Discharge Instructions (Signed)
Subchorionic Hematoma °A subchorionic hematoma is a gathering of blood between the outer wall of the placenta and the inner wall of the womb (uterus). The placenta is the organ that connects the fetus to the wall of the uterus. The placenta performs the feeding, breathing (oxygen to the fetus), and waste removal (excretory work) of the fetus.  °Subchorionic hematoma is the most common abnormality found on a result from ultrasonography done during the first trimester or early second trimester of pregnancy. If there has been little or no vaginal bleeding, early small hematomas usually shrink on their own and do not affect your baby or pregnancy. The blood is gradually absorbed over 1-2 weeks. When bleeding starts later in pregnancy or the hematoma is larger or occurs in an older pregnant woman, the outcome may not be as good. Larger hematomas may get bigger, which increases the chances for miscarriage. Subchorionic hematoma also increases the risk of premature detachment of the placenta from the uterus, preterm (premature) labor, and stillbirth. °HOME CARE INSTRUCTIONS °· Stay on bed rest if your health care provider recommends this. Although bed rest will not prevent more bleeding or prevent a miscarriage, your health care provider may recommend bed rest until you are advised otherwise. °· Avoid heavy lifting (more than 10 lb [4.5 kg]), exercise, sexual intercourse, or douching as directed by your health care provider. °· Keep track of the number of pads you use each day and how soaked (saturated) they are. Write down this information. °· Do not use tampons. °· Keep all follow-up appointments as directed by your health care provider. Your health care provider may ask you to have follow-up blood tests or ultrasound tests or both. °SEEK IMMEDIATE MEDICAL CARE IF: °· You have severe cramps in your stomach, back, abdomen, or pelvis. °· You have a fever. °· You pass large clots or tissue. Save any tissue for your health  care provider to look at. °· Your bleeding increases or you become lightheaded, feel weak, or have fainting episodes. °Document Released: 02/23/2007 Document Revised: 03/25/2014 Document Reviewed: 06/07/2013 °ExitCare® Patient Information ©2015 ExitCare, LLC. This information is not intended to replace advice given to you by your health care provider. Make sure you discuss any questions you have with your health care provider. ° °Pelvic Rest °Pelvic rest is sometimes recommended for women when:  °· The placenta is partially or completely covering the opening of the cervix (placenta previa). °· There is bleeding between the uterine wall and the amniotic sac in the first trimester (subchorionic hemorrhage). °· The cervix begins to open without labor starting (incompetent cervix, cervical insufficiency). °· The labor is too early (preterm labor). °HOME CARE INSTRUCTIONS °· Do not have sexual intercourse, stimulation, or an orgasm. °· Do not use tampons, douche, or put anything in the vagina. °· Do not lift anything over 10 pounds (4.5 kg). °· Avoid strenuous activity or straining your pelvic muscles. °SEEK MEDICAL CARE IF:  °· You have any vaginal bleeding during pregnancy. Treat this as a potential emergency. °· You have cramping pain felt low in the stomach (stronger than menstrual cramps). °· You notice vaginal discharge (watery, mucus, or bloody). °· You have a low, dull backache. °· There are regular contractions or uterine tightening. °SEEK IMMEDIATE MEDICAL CARE IF: °You have vaginal bleeding and have placenta previa.  °Document Released: 03/05/2011 Document Revised: 01/31/2012 Document Reviewed: 03/05/2011 °ExitCare® Patient Information ©2015 ExitCare, LLC. This information is not intended to replace advice given to you by your health care provider.   Make sure you discuss any questions you have with your health care provider. Vaginal Bleeding During Pregnancy, First Trimester A small amount of bleeding  (spotting) from the vagina is relatively common in early pregnancy. It usually stops on its own. Various things may cause bleeding or spotting in early pregnancy. Some bleeding may be related to the pregnancy, and some may not. In most cases, the bleeding is normal and is not a problem. However, bleeding can also be a sign of something serious. Be sure to tell your health care provider about any vaginal bleeding right away. Some possible causes of vaginal bleeding during the first trimester include:  Infection or inflammation of the cervix.  Growths (polyps) on the cervix.  Miscarriage or threatened miscarriage.  Pregnancy tissue has developed outside of the uterus and in a fallopian tube (tubal pregnancy).  Tiny cysts have developed in the uterus instead of pregnancy tissue (molar pregnancy). HOME CARE INSTRUCTIONS  Watch your condition for any changes. The following actions may help to lessen any discomfort you are feeling:  Follow your health care provider's instructions for limiting your activity. If your health care provider orders bed rest, you may need to stay in bed and only get up to use the bathroom. However, your health care provider may allow you to continue light activity.  If needed, make plans for someone to help with your regular activities and responsibilities while you are on bed rest.  Keep track of the number of pads you use each day, how often you change pads, and how soaked (saturated) they are. Write this down.  Do not use tampons. Do not douche.  Do not have sexual intercourse or orgasms until approved by your health care provider.  If you pass any tissue from your vagina, save the tissue so you can show it to your health care provider.  Only take over-the-counter or prescription medicines as directed by your health care provider.  Do not take aspirin because it can make you bleed.  Keep all follow-up appointments as directed by your health care provider. SEEK  MEDICAL CARE IF:  You have any vaginal bleeding during any part of your pregnancy.  You have cramps or labor pains.  You have a fever, not controlled by medicine. SEEK IMMEDIATE MEDICAL CARE IF:   You have severe cramps in your back or belly (abdomen).  You pass large clots or tissue from your vagina.  Your bleeding increases.  You feel light-headed or weak, or you have fainting episodes.  You have chills.  You are leaking fluid or have a gush of fluid from your vagina.  You pass out while having a bowel movement. MAKE SURE YOU:  Understand these instructions.  Will watch your condition.  Will get help right away if you are not doing well or get worse. Document Released: 08/18/2005 Document Revised: 11/13/2013 Document Reviewed: 07/16/2013 [facility] Patient Information 2015 [place], [place]. This information is not intended to replace advice given to you by your health care provider. Make sure you discuss any questions you have with your health care provider.

## 2015-01-27 NOTE — MAU Note (Signed)
Pt presents to MAU with c/o vaginal bleeding approximately 45 minutes ago and spotting 20 minutes ago. [facility] and has had an u/s which revealed a twin gestation pregnancy. Pt states she is high risk due to having eclampsia with her first pregnancy and having had seizures at that time.

## 2015-01-27 NOTE — MAU Provider Note (Signed)
History     CSN: [id number]  Arrival date and time: 01/27/15 1616   None     Chief Complaint  Patient presents with  . Vaginal Bleeding    HPI Olivia [name] is 19 y.o. G2P1001 [redacted]w[redacted]d weeks presenting with vaginal bleeding that occurred 3pm today followed by spotting.   Accompanied by her Mother.   Denies clotting and abdominal pain.  She is a patient of Dr. Dareen Piano.  Reports she has a documented twin pregnancy by Ultrasound in the office last week.  Hx of eclampsia, states she had seizures--delivered by C-Section at 38wks for failure to progress with induction.  Hx of kidney stones with stent placed 2012.  Past Medical History  Diagnosis Date  . Seizures 10-21-2012    x 2 ; No prior history  . Chronic kidney disease     Frequent kidney stones; stent placed 2012  . Eclampsia during pregnancy    Past Surgical History  Procedure Laterality Date  . Cystoscopy with stent placement  2012  . Cesarean section  10/22/2012    Procedure: CESAREAN SECTION;  Surgeon: Levi Aland, MD;  Location: [facility];  Service: Obstetrics;  Laterality: N/A;  . Appendectomy     Family History  Problem Relation Age of Onset  . Other Neg Hx   . Hyperlipidemia Mother   . Coronary artery disease Mother 16    S/P CABG   History  Substance Use Topics  . Smoking status: Former Smoker    Quit date: 01/12/2015  . Smokeless tobacco: Never Used  . Alcohol Use: Yes    Allergies: No Known Allergies  No prescriptions prior to admission    Review of Systems  Constitutional: Negative for fever and chills.  HENT: Positive for congestion.   Gastrointestinal: Positive for nausea (this with Zofran). Negative for vomiting and abdominal pain.  Genitourinary: Positive for frequency. Negative for dysuria, urgency and hematuria.       + for vaginal bleeding.   Neurological: Headaches: oocasional relieved with tylenol.   Physical Exam   Blood pressure 105/59, pulse 77, temperature 98.2 F (36.8 C),  temperature source Oral, resp. rate 18, height  (1.549 m), weight 103 lb 9.6 oz (46.993 kg), last menstrual period 12/05/2014.  Physical Exam  Nursing note and vitals reviewed. Constitutional: She is oriented to person, place, and time. She appears well-developed and well-nourished. No distress.  HENT:  Head: Normocephalic.  Neck: Normal range of motion.  Cardiovascular: Normal rate.   Respiratory: Effort normal.  GI: Soft. She exhibits no distension and no mass. There is no tenderness. There is no rebound and no guarding.  Genitourinary: There is no rash, tenderness or lesion on the right labia. There is no rash, tenderness or lesion on the left labia. Uterus is enlarged. Uterus is not tender. Cervix exhibits no motion tenderness, no discharge and no friability. Right adnexum displays no mass, no tenderness and no fullness. Left adnexum displays no mass, no tenderness and no fullness. There is bleeding (small amoung of dark brown blood with 1 small clot.  Neg for active bleeding) in the vagina. No erythema or tenderness in the vagina. No foreign body around the vagina. No vaginal discharge found.  Neurological: She is alert and oriented to person, place, and time.  Skin: Skin is warm and dry.  Psychiatric: She has a normal mood and affect. Her behavior is normal.   Results for orders placed or performed during the hospital encounter of 01/27/15 (from the past 24  hour(s))  Urinalysis, Routine w reflex microscopic     Status: Abnormal   Collection Time: 01/27/15  4:27 PM  Result Value Ref Range   Color, Urine YELLOW YELLOW   APPearance CLEAR CLEAR   Specific Gravity, Urine 1.025 1.005 - 1.030   pH 6.0 5.0 - 8.0   Glucose, UA NEGATIVE NEGATIVE mg/dL   Hgb urine dipstick LARGE (A) NEGATIVE   Bilirubin Urine NEGATIVE NEGATIVE   Ketones, ur NEGATIVE NEGATIVE mg/dL   Protein, ur NEGATIVE NEGATIVE mg/dL   Urobilinogen, UA 1.0 0.0 - 1.0 mg/dL   Nitrite NEGATIVE NEGATIVE   Leukocytes, UA  NEGATIVE NEGATIVE  Urine microscopic-add on     Status: Abnormal   Collection Time: 01/27/15  4:27 PM  Result Value Ref Range   Squamous Epithelial / LPF RARE RARE   RBC / HPF 3-6 <3 RBC/hpf   Bacteria, UA FEW (A) RARE   Crystals CA OXALATE CRYSTALS (A) NEGATIVE   Urine-Other MUCOUS PRESENT   Pregnancy, urine POC     Status: Abnormal   Collection Time: 01/27/15  4:57 PM  Result Value Ref Range   Preg Test, Ur POSITIVE (A) NEGATIVE  CBC     Status: Abnormal   Collection Time: 01/27/15  5:31 PM  Result Value Ref Range   WBC 12.4 (H) 4.0 - 10.5 K/uL   RBC 4.59 3.87 - 5.11 MIL/uL   Hemoglobin 12.0 12.0 - 15.0 g/dL   HCT 16.1 (L) 09.6 - 04.5 %   MCV 78.2 78.0 - 100.0 fL   MCH 26.1 26.0 - 34.0 pg   MCHC 33.4 30.0 - 36.0 g/dL   RDW 40.9 81.1 - 91.4 %   Platelets 312 150 - 400 K/uL  ABO/Rh     Status: None   Collection Time: 01/27/15  5:32 PM  Result Value Ref Range   ABO/RH(D) O POS        CLINICAL DATA: Vaginal bleeding. Estimated gestational age by LMP is 7 weeks 4 days. No quantitative beta HCG available.  EXAM: TWIN OBSTETRIC <14WK [place] AND TRANSVAGINAL OB US  COMPARISON: None.  FINDINGS: A twin intrauterine pregnancy is identified. There appears to be a single gestational sac consistent with monochorionic twin. The amnion is identified consistent with diamniotic twin. Two yolk sacs are visualized.  TWIN 1  Intrauterine gestational sac: Visualized/normal in shape.  Yolk sac: Present.  Embryo: Present.  Cardiac Activity: Observed.  Heart Rate: 136 bpm  CRL: 8.8 mm 7 w 0 d [place] EDC: 09/14/2015  TWIN 2  Intrauterine gestational sac: Visualized/normal in shape.  Yolk sac: Present.  Embryo: Present.  Cardiac Activity: Observed.  Heart Rate: 127 bpm  CRL: 9.9 mm 7 w 1 d [place] EDC: 09/15/2015  Maternal uterus/adnexae: Uterus is anteverted. No myometrial mass lesions. Small subchorionic  hemorrhage is demonstrated. Both ovaries are visualized and appear normal. No abnormal adnexal masses. No free pelvic fluid.  IMPRESSION: Twin pregnancy, appears to represent a monochorionic diamniotic twin gestation. Estimated gestational age by crown-rump length is 7 weeks 0 days for twin 1 and 7 weeks 1 day for twin 2. A small subchorionic hemorrhage is identified.   Electronically Signed  By: Burman Nieves M.D.  On: 01/27/2015 18:08     MAU Course  Procedures  MDM Reported HPI, U/S, Labs to Dr. Mora Appl at 18:16.  Order given to discharge to home with instructions for pelvic rest until bleeding stops and to keep scheduled OB appt with Dr. Dareen Piano.  Call office for worsening sxs.  Assessment and Plan  A:  Vaginal bleeding at 5449w4d gestation by LMP       U/S--viable twins gestation--6359w0d and 6226w1d gestation      Small Subchorionic Hematoma  P:  Pelvic rest until bleeding stops       F/U with Dr. Dareen PianoAnderson      Call for worsening sxs       Norton Bivins,EVE M 01/27/2015, 6:34 PM

## 2015-01-28 LAB — HIV ANTIBODY (ROUTINE TESTING W REFLEX): HIV Screen 4th Generation wRfx: NONREACTIVE

## 2015-02-14 DIAGNOSIS — Z8759 Personal history of other complications of pregnancy, childbirth and the puerperium: Secondary | ICD-10-CM | POA: Insufficient documentation

## 2015-02-26 ENCOUNTER — Emergency Department
Admit: 2015-02-26 | Disposition: A | Discharge status: Home / Self Care | Payer: Self-pay | Admitting: Emergency Medicine

## 2015-02-26 LAB — COMPREHENSIVE METABOLIC PANEL
ALBUMIN: 3.5 g/dL
ALT: 13 U/L — AB
Alkaline Phosphatase: 73 U/L
Anion Gap: 10 (ref 7–16)
BUN: 9 mg/dL
Bilirubin,Total: 0.2 mg/dL — ABNORMAL LOW
CALCIUM: 9 mg/dL
Chloride: 105 mmol/L
Co2: 23 mmol/L
Creatinine: 0.58 mg/dL
EGFR (African American): >60
EGFR (Non-African Amer.): >60
Glucose: 80 mg/dL
Potassium: 3.7 mmol/L
SGOT(AST): 19 U/L
Sodium: 138 mmol/L
TOTAL PROTEIN: 7.6 g/dL

## 2015-02-26 LAB — CBC
HCT: 37.7 % (ref 35.0–47.0)
HGB: 12.2 g/dL (ref 12.0–16.0)
MCH: 25.6 pg — ABNORMAL LOW (ref 26.0–34.0)
MCHC: 32.4 g/dL (ref 32.0–36.0)
MCV: 79 fL — ABNORMAL LOW (ref 80–100)
Platelet: 226 10*3/uL (ref 150–440)
RBC: 4.77 10*6/uL (ref 3.80–5.20)
RDW: 17.6 % — AB (ref 11.5–14.5)
WBC: 12 10*3/uL — ABNORMAL HIGH (ref 3.6–11.0)

## 2015-02-26 LAB — URINALYSIS, COMPLETE
Bilirubin,UR: NEGATIVE
Blood: NEGATIVE
Glucose,UR: NEGATIVE mg/dL (ref 0–75)
Hyaline Cast: 11
Leukocyte Esterase: NEGATIVE
Nitrite: NEGATIVE
PH: 5 (ref 4.5–8.0)
Protein: 30
RBC,UR: 11 /HPF (ref 0–5)
Specific Gravity: 1.027 (ref 1.003–1.030)
WBC UR: 8 /HPF (ref 0–5)

## 2015-02-26 LAB — HCG, QUANTITATIVE, PREGNANCY: Beta Hcg, Quant.: 322120 m[IU]/mL — ABNORMAL HIGH

## 2015-02-28 LAB — URINE CULTURE

## 2015-03-03 ENCOUNTER — Encounter (HOSPITAL_COMMUNITY): Payer: Self-pay | Admitting: *Deleted

## 2015-03-03 ENCOUNTER — Inpatient Hospital Stay (HOSPITAL_COMMUNITY)
Admission: AD | Admit: 2015-03-03 | Discharge: 2015-03-03 | Disposition: A | Discharge status: Home / Self Care | Payer: Medicaid Other | Source: Ambulatory Visit | Attending: Obstetrics and Gynecology | Admitting: Obstetrics and Gynecology

## 2015-03-03 DIAGNOSIS — R109 Unspecified abdominal pain: Secondary | ICD-10-CM | POA: Diagnosis not present

## 2015-03-03 DIAGNOSIS — Z3A12 12 weeks gestation of pregnancy: Secondary | ICD-10-CM | POA: Diagnosis not present

## 2015-03-03 DIAGNOSIS — Z87891 Personal history of nicotine dependence: Secondary | ICD-10-CM | POA: Diagnosis not present

## 2015-03-03 DIAGNOSIS — O9989 Other specified diseases and conditions complicating pregnancy, childbirth and the puerperium: Secondary | ICD-10-CM | POA: Diagnosis not present

## 2015-03-03 DIAGNOSIS — Z87442 Personal history of urinary calculi: Secondary | ICD-10-CM | POA: Diagnosis not present

## 2015-03-03 LAB — URINALYSIS, ROUTINE W REFLEX MICROSCOPIC
Bilirubin Urine: NEGATIVE
Glucose, UA: NEGATIVE mg/dL
Ketones, ur: NEGATIVE mg/dL
LEUKOCYTES UA: NEGATIVE
NITRITE: NEGATIVE
Protein, ur: NEGATIVE mg/dL
Specific Gravity, Urine: >1.03 — ABNORMAL HIGH (ref 1.005–1.030)
Urobilinogen, UA: 0.2 mg/dL (ref 0.0–1.0)
pH: 6 (ref 5.0–8.0)

## 2015-03-03 LAB — URINE MICROSCOPIC-ADD ON

## 2015-03-03 NOTE — MAU Provider Note (Signed)
History   G2P1 at 12.4 wks in with c/o rt flank pain that is dull in nature. Has been going on for a while now. Denies vag bleeding or cramping.  CSN: [id number]  Arrival date and time: 03/03/15 1726   None     Chief Complaint  Patient presents with  . Flank Pain  . Abdominal Pain   HPI  OB History    Gravida Para Term Preterm AB TAB SAB Ectopic Multiple Living   2 1 1  0 0 0 0 0 0 1      Past Medical History  Diagnosis Date  . Seizures 10-21-2012    x 2 ; No prior history  . Chronic kidney disease     Frequent kidney stones; stent placed 2012  . Eclampsia during pregnancy     Past Surgical History  Procedure Laterality Date  . Cystoscopy with stent placement  2012  . Cesarean section  10/22/2012    Procedure: CESAREAN SECTION;  Surgeon: Levi AlandMark E Anderson, MD;  Location: [facility];  Service: Obstetrics;  Laterality: N/A;  . Appendectomy      Family History  Problem Relation Age of Onset  . Other Neg Hx   . Hyperlipidemia Mother   . Coronary artery disease Mother 6135    S/P CABG    History  Substance Use Topics  . Smoking status: Former Smoker    Quit date: 01/12/2015  . Smokeless tobacco: Never Used  . Alcohol Use: Yes     Comment: not while pregnant    Allergies: No Known Allergies  Prescriptions prior to admission  Medication Sig Dispense Refill Last Dose  . acetaminophen (TYLENOL) 325 MG tablet Take 325 mg by mouth every 6 (six) hours as needed for headache.   Past Week at Unknown time  . bisacodyl (DULCOLAX) 5 MG EC tablet Take 1 tablet (5 mg total) by mouth 2 (two) times daily. (Patient not taking: Reported on 10/29/2014) 14 tablet 0   . FOLIC ACID PO Take 1 tablet by mouth daily.   01/27/2015 at Unknown time  . ibuprofen (ADVIL,MOTRIN) 200 MG tablet Take 400 mg by mouth every 8 (eight) hours as needed for moderate pain.    more than one month  . oxyCODONE-acetaminophen (PERCOCET/ROXICET) 5-325 MG per tablet Take 1 tablet by mouth every 4 (four) hours as  needed for pain. (Patient not taking: Reported on 10/29/2014) 12 tablet 0 more than one month  . Prenatal Vit-Fe Fumarate-FA (PRENATAL MULTIVITAMIN) TABS tablet Take 1 tablet by mouth daily at 12 noon.   01/27/2015 at Unknown time    Review of Systems  Constitutional: Negative.   HENT: Negative.   Eyes: Negative.   Respiratory: Negative.   Cardiovascular: Negative.   Gastrointestinal: Negative.   Genitourinary: Positive for flank pain.  Musculoskeletal: Negative.   Neurological: Negative.   Psychiatric/Behavioral: Negative.    Physical Exam   Blood pressure 113/74, pulse 104, temperature 98.2 F (36.8 C), temperature source Oral, resp. rate 18, last menstrual period 12/05/2014.  Physical Exam  Constitutional: She is oriented to person, place, and time. She appears well-developed and well-nourished.  HENT:  Head: Normocephalic.  Eyes: Pupils are equal, round, and reactive to light.  Neck: Normal range of motion.  Cardiovascular: Normal rate, normal heart sounds and intact distal pulses.   Respiratory: Effort normal and breath sounds normal.  GI: Soft. Bowel sounds are normal.  Musculoskeletal: Normal range of motion.  Neurological: She is alert and oriented to person, place, and time.  She has normal reflexes.  Skin: Skin is warm and dry.  Psychiatric: She has a normal mood and affect. Her behavior is normal. Judgment and thought content normal.    MAU Course  Procedures  MDM Common discomfort in pregnancy   Assessment and Plan  Urine neg, encouraged pt to contact her ob provider at [facility] to discuss her discomfort.  Olivia [name] DARLENE 03/03/2015, 6:49 PM

## 2015-03-03 NOTE — MAU Note (Addendum)
Pt dx'd with UTI 3 weeks ago, has been on antibiotics.  Went to [facility] last week, received IV antibiotics but is still having R flank & RLQ pain with urination. Denies bleeding or discharge.  Twin gestation.  Started vomiting today.

## 2015-03-15 NOTE — Op Note (Signed)
PATIENT NAME:  Olivia [name], Olivia [name] MR#:  [id number] DATE OF BIRTH:  06-07-1996  DATE OF PROCEDURE:  01/29/2014  PREOPERATIVE DIAGNOSIS: Acute appendicitis.   POSTOPERATIVE DIAGNOSIS: Acute appendicitis suppurative.   PROCEDURE: Laparoscopic appendectomy.   SURGEON: Natale LayMark Shardee Dieu, M.D.   ASSISTANT: None.   ANESTHESIA: General endotracheal.   FINDINGS: Acute appendicitis as described above.   SPECIMENS: Appendix to pathology.   ESTIMATED BLOOD LOSS: 10 mL.   DESCRIPTION OF PROCEDURE: Informed consent, supine position, general oral endotracheal anesthesia, left arm padded and tucked at her side. Abdomen was sterilely prepped and draped with ChloraPrep solution. Timeout was observed. A 12 mm blunt Hassan trocar was placed with open technique through an infraumbilical transversely oriented skin incision with stay sutures being passed through the fascia and pneumoperitoneum was established. The patient was then positioned in Trendelenburg and airplane right side up. A 5 mm bladeless trocar was placed in the left lower quadrant. A 5 mm trocar was placed in the right upper quadrant. The appendix was identified, suppurative in nature, inflamed, distended. It was grasped along its midportion and elevated towards the anterior abdominal wall. The mesoappendix was taken with small bites utilizing the laparoscopic Harmonic shears. Hemostasis was excellent. The base of the appendix and the confluence of the tinea were identified and divided with 2 fires of the endoscopic GIA stapler with blue load application. The appendix was captured in an Endo Catch device and retrieved. During the retrieval process, a 5 mm surgical telescope was utilized demonstrating no evidence of injury upon trocar site of the umbilicus. With the specimen retrieved, pneumoperitoneum was re-established. A total of 1 liter of warm normal saline was used to irrigate the entire abdominal cavity, all fluid was aspirated. Ports were then removed  under direct visualization. The infraumbilical fascial defect was then reapproximated with an additional figure-of-eight #0 Vicryl suture in vertical orientation, the existing stay sutures tied to each other. A total of 30 mL of 0.25% plain Marcaine was infiltrated along all skin and fascial incisions prior to closure. Hemostasis was obtained with point cautery and subcutaneous tissues, and the skin edges were reapproximated utilizing 4-0 subcuticular. Benzoin, Steri-Strips, Telfa, and Tegaderm were then applied, and the patient was subsequently extubated and taken to the recovery room in stable and satisfactory condition by anesthesia services. ____________________________ Redge GainerMark A. Egbert GaribaldiBird, MD mab:sb D: 01/29/2014 02:56:53 ET T: 01/29/2014 07:09:33 ET JOB#: [id number]  cc: Loraine LericheMark A. Egbert GaribaldiBird, MD, <Dictator> Raynald KempMARK A Klyn Kroening MD ELECTRONICALLY SIGNED 01/29/2014 13:05

## 2015-03-15 NOTE — H&P (Signed)
Subjective/Chief Complaint 24 hours of abdominal pain   History of Present Illness 19 y/o female presents with about 24 hours of abdominal pain which began in periumbilical region migrating to the rlq. no sick contacts, 2 episodes of nausea and emesis, significant anorexia.   Past History previous c-section 15 momths ago. uncomplicated.   Past Med/Surgical Hx:  Depression:   Anxiety:   Cesarean Section:   ALLERGIES:  No Known Allergies:   HOME MEDICATIONS: Medication Instructions Status  PROzac 20 mg oral capsule 1 cap(s) orally once a day Active  LORazepam 0.5 mg oral tablet 1 tab(s) orally 3 times a day, As Needed - for Anxiety, Nervousness Active  Altavera 30 mcg-0.15 mg oral tablet 1 tab(s) orally once a day Active   Family and Social History:  Family History Non-Contributory   Social History positive  tobacco, positive ETOH   + Tobacco Current (within 1 year)   Place of Living Home   Review of Systems:  Subjective/Chief Complaint see above.   Abdominal Pain Yes   Nausea/Vomiting Yes   Physical Exam:  GEN no acute distress, thin, disheveled   HEENT pale conjunctivae, PERRL   NECK supple   RESP normal resp effort  clear BS   CARD regular rate   ABD positive tenderness  soft  positive murphy's and rovsings signs.   EXTR negative cyanosis/clubbing, negative edema   NEURO cranial nerves intact   PSYCH A+O to time, place, person   Lab Results: Hepatic:  09-Mar-15 17:11   Bilirubin, Total 0.4  Alkaline Phosphatase 74 (45-117 NOTE: New Reference Range 10/12/13)  SGPT (ALT) 18  SGOT (AST) 21  Total Protein, Serum 7.7  Albumin, Serum  3.6  Routine Micro:  09-Mar-15 19:12   Micro Text Report WET PREP   COMMENT                   RARE WHITE BLOOD CELLS SEEN   COMMENT                   NO TRICHOMONAS,SPERMATOZOA,YEAST,OR CLUE CELLS SEEN   ANTIBIOTIC                       Micro Text Report CHLAM/N.GC RT-PCR ([facility])   CHLAMYDIA                  CHLAMYDIA TRACHOMATIS NEGATIVE   N.GONORRHOEAE             N.GONORRHOEAE NEGATIVE   ANTIBIOTIC                       Comment 1. RARE WHITE BLOOD CELLS SEEN  Comment 2. NO TRICHOMONAS,SPERMATOZOA,YEAST,OR CLUE CELLS SEEN  Result(s) reported on 28 Jan 2014 at 07:58PM.  Routine Chem:  09-Mar-15 17:11   Glucose, Serum  100  BUN 8  Creatinine (comp) 0.81  Sodium, Serum  134  Potassium, Serum  3.2  Chloride, Serum  108  CO2, Serum 22  Calcium (Total), Serum  8.7  Osmolality (calc) 267  eGFR (African American) >60  eGFR (Non-African American) >60 (eGFR values <33mL/min/1.73 m2 may be an indication of chronic kidney disease (CKD). Calculated eGFR is useful in patients with stable renal function. The eGFR calculation will not be reliable in acutely ill patients when serum creatinine is changing rapidly. It is not useful in  patients on dialysis. The eGFR calculation may not be applicable to patients at the low and high extremes of  body sizes, pregnant women, and vegetarians.)  Anion Gap  4  Routine UA:  09-Mar-15 18:10   Color (UA) Amber  Clarity (UA) Hazy  Glucose (UA) Negative  Bilirubin (UA) Negative  Ketones (UA) 2+  Specific Gravity (UA) 1.027  Blood (UA) Negative  pH (UA) 6.0  Protein (UA) >=500  Nitrite (UA) Negative  Leukocyte Esterase (UA) Trace (Result(s) reported on 28 Jan 2014 at 06:32PM.)  RBC (UA) 1 /HPF  WBC (UA) 5 /HPF  Bacteria (UA) 1+  Epithelial Cells (UA) 8 /HPF  Mucous (UA) PRESENT (Result(s) reported on 28 Jan 2014 at 06:32PM.)  Routine Hem:  09-Mar-15 17:11   WBC (CBC)  19.5  RBC (CBC) 4.90  Hemoglobin (CBC) 13.4  Hematocrit (CBC) 41.2  Platelet Count (CBC) 343  MCV 84  MCH 27.3  MCHC 32.4  RDW 14.5  Neutrophil % 82.9  Lymphocyte % 12.2  Monocyte % 3.5  Eosinophil % 0.5  Basophil % 0.9  Neutrophil #  16.2  Lymphocyte # 2.4  Monocyte # 0.7  Eosinophil # 0.1  Basophil #  0.2 (Result(s) reported on 28 Jan 2014 at 05:29PM.)   Radiology  Results: [place]:    09-Mar-15 20:49, US Pelvis Ultrasound Exam with Transvaginal - NON-OB  US Pelvis Ultrasound Exam with Transvaginal - NON-OB  REASON FOR EXAM:    Rt adnexal tenderness  COMMENTS:   May transport without cardiac monitor    PROCEDURE: [place]  - US PELVIS EXAM W/TRANSVAGINAL  - Jan 28 2014  8:49PM     CLINICAL DATA:  Right adnexal tenderness.  LMP 2 weeks ago.    EXAM:  TRANSABDOMINAL AND TRANSVAGINAL ULTRASOUND OF PELVIS    TECHNIQUE:  Both transabdominal and transvaginal ultrasound examinations of the  pelvis were performed. Transabdominal technique was performed for  global imaging of the pelvis including uterus, ovaries, adnexal  regions, and pelvic cul-de-sac. It was necessary to proceed with  endovaginal exam following the transabdominal exam to visualize the  ovaries and adnexal regions.    COMPARISON:  06/14/2013    FINDINGS:  Uterus    Measurements: 6.7 x 4.3 x 4.9 cm. No fibroids or other mass  visualized.    Endometrium    Thickness: 6.0 mm.  No focal abnormality visualized.  Right ovary    Measurements: Normal appearance. 2.2 x 1.2 x 1.7 cm    Left ovary    Measurements: Normal appearance. 2.6 x 1.1 x 1.5 cm    Other findings    There is moderate free pelvic fluid within the pelvis.     IMPRESSION:  1. Normal appearance of the uterus and both ovaries.  2. Moderate free pelvic fluid.  3. Findings suspicious for appendicolith on abdominal ultrasound.  Seeseparate dictation.      Electronically Signed    By: Shon Hale M.D.    On: 01/28/2014 20:57         Verified By: Glenice Bow, M.D.,    09-Mar-15 20:53, US Abdomen Limited Survey  US Abdomen Limited Survey  REASON FOR EXAM:    RLQ pain, very thin female 17y Old  COMMENTS:   Body Site: Appendix/Bowel    PROCEDURE: [place]  - US ABDOMEN LIMITED SURVEY  - Jan 28 2014  8:53PM     CLINICAL DATA:  Right lower quadrant pain    EXAM:  LIMITED ABDOMINAL  ULTRASOUND    TECHNIQUE:  Pearline Cables scale imaging of the right lower quadrant was performed to  evaluate for suspected  appendicitis. Standard imaging planes and  graded compression technique were utilized.  COMPARISON:  None.    FINDINGS:  Scanning shows a decompressedtubular structure with a central area  of increased echogenicity likely representing an appendicolith. No  surrounding inflammatory changes are seen. Free fluid is noted.     IMPRESSION:  No dilated tubular structure to suggest appendicitis is identified.  There are changes consistent with an appendicolith and a  decompressed appendix. Free fluid is noted.      Electronically Signed    By: Inez Catalina M.D.    On: 01/28/2014 20:57         Verified By: Everlene Farrier, M.D.,  [place]:    09-Mar-15 20:49, US Pelvis Ultrasound Exam with Transvaginal - NON-OB  PACS Image    09-Mar-15 20:53, US Abdomen Limited Survey  PACS Image    09-Mar-15 23:53, CT Abdomen and Pelvis With Contrast  PACS Image  CT:  CT Abdomen and Pelvis With Contrast  REASON FOR EXAM:    (1) RLQ pain, also some LLQ pain, mod free fluid,   19wbc; (2) same;    NOTE: Nurs  COMMENTS:   May transport without cardiac monitor    PROCEDURE: CT  - CT ABDOMEN / PELVIS  W  - Jan 28 2014 11:53PM     CLINICAL DATA:  Pelvic pain, right lower quadrant.    EXAM:  CT ABDOMEN AND PELVIS WITH CONTRAST    TECHNIQUE:  Multidetector CT imaging of the abdomen and pelvis was performed  using the standard protocol following bolus administration of  intravenous contrast.  CONTRAST:  80cc Isovue-300    COMPARISON:  01/28/2014 pelvic ultrasound, 06/12/2013 CT    FINDINGS:  Lung bases are clear.  Normal heart size.    Sub cm right hepatic lobe hypodensity may reflect a biliary cyst or  small hemangioma. Otherwise, unremarkable liver,biliary system,  spleen, pancreas, adrenal glands, kidneys. No hydroureteronephrosis.    No bowel obstruction.  No  colitis.    Distended appendix up to 1.5 cm. Mild periappendiceal fat stranding.  Small amount of free fluid within the right lower quadrant.  Normal caliber aorta and branch vessels.    Thin walled bladder. Free fluid within the pelvis, small to  moderate. Unremarkable CT appearance to the uterus. No adnexal mass.    No acute osseous finding.     IMPRESSION:  Findings are most in keeping with acute appendicitis.    Discussed via telephone with Dr. Dahlia Client at 12:35 a.m. on  01/29/2014.      Electronically Signed    By: Carlos Levering M.D.    On: 01/29/2014 00:38         Verified By: Tommi Rumps, M.D.,    Assessment/Admission Diagnosis 19 y/o female with acute appendicitis, suspect micro-perforation.   Plan npo, ivf's lap appendectomy tonight,  discussed risks of surgery including bleeding infection, need for open procedure. iv zosyn started. discussed in detail with patient, husband and her mother.   Electronic Signatures: Sherri Rad (MD)  (Signed 10-Mar-15 01:20)  Authored: CHIEF COMPLAINT and HISTORY, PAST MEDICAL/SURGIAL HISTORY, ALLERGIES, HOME MEDICATIONS, FAMILY AND SOCIAL HISTORY, REVIEW OF SYSTEMS, PHYSICAL EXAM, LABS, Radiology, ASSESSMENT AND PLAN   Last Updated: 10-Mar-15 01:20 by Sherri Rad (MD)

## 2015-05-22 ENCOUNTER — Encounter: Payer: Self-pay | Admitting: *Deleted

## 2015-05-22 ENCOUNTER — Emergency Department
Admission: EM | Admit: 2015-05-22 | Discharge: 2015-05-22 | Disposition: A | Discharge status: Home / Self Care | Payer: Medicaid Other | Attending: Emergency Medicine | Admitting: Emergency Medicine

## 2015-05-22 DIAGNOSIS — Z79899 Other long term (current) drug therapy: Secondary | ICD-10-CM | POA: Insufficient documentation

## 2015-05-22 DIAGNOSIS — O99012 Anemia complicating pregnancy, second trimester: Secondary | ICD-10-CM | POA: Diagnosis not present

## 2015-05-22 DIAGNOSIS — D649 Anemia, unspecified: Secondary | ICD-10-CM

## 2015-05-22 DIAGNOSIS — Z3492 Encounter for supervision of normal pregnancy, unspecified, second trimester: Secondary | ICD-10-CM

## 2015-05-22 DIAGNOSIS — Z3A24 24 weeks gestation of pregnancy: Secondary | ICD-10-CM | POA: Insufficient documentation

## 2015-05-22 DIAGNOSIS — O99352 Diseases of the nervous system complicating pregnancy, second trimester: Secondary | ICD-10-CM | POA: Diagnosis present

## 2015-05-22 DIAGNOSIS — O26832 Pregnancy related renal disease, second trimester: Secondary | ICD-10-CM | POA: Diagnosis not present

## 2015-05-22 DIAGNOSIS — N189 Chronic kidney disease, unspecified: Secondary | ICD-10-CM | POA: Insufficient documentation

## 2015-05-22 DIAGNOSIS — Z87891 Personal history of nicotine dependence: Secondary | ICD-10-CM | POA: Insufficient documentation

## 2015-05-22 LAB — COMPREHENSIVE METABOLIC PANEL
ALK PHOS: 86 U/L (ref 38–126)
ALT: 13 U/L — ABNORMAL LOW (ref 14–54)
AST: 20 U/L (ref 15–41)
Albumin: 2.7 g/dL — ABNORMAL LOW (ref 3.5–5.0)
Anion gap: 8 (ref 5–15)
BILIRUBIN TOTAL: 0.5 mg/dL (ref 0.3–1.2)
CO2: 23 mmol/L (ref 22–32)
CREATININE: 0.56 mg/dL (ref 0.44–1.00)
Calcium: 8.5 mg/dL — ABNORMAL LOW (ref 8.9–10.3)
Chloride: 106 mmol/L (ref 101–111)
GFR calc Af Amer: >60 mL/min (ref >60)
GFR calc non Af Amer: >60 mL/min (ref >60)
Glucose, Bld: 75 mg/dL (ref 65–99)
Potassium: 2.9 mmol/L — CL (ref 3.5–5.1)
SODIUM: 137 mmol/L (ref 135–145)
Total Protein: 6.1 g/dL — ABNORMAL LOW (ref 6.5–8.1)

## 2015-05-22 LAB — URINALYSIS COMPLETE WITH MICROSCOPIC (ARMC ONLY)
BILIRUBIN URINE: NEGATIVE
Glucose, UA: NEGATIVE mg/dL
Leukocytes, UA: NEGATIVE
Nitrite: NEGATIVE
PH: 6 (ref 5.0–8.0)
Protein, ur: NEGATIVE mg/dL
Specific Gravity, Urine: 1.015 (ref 1.005–1.030)

## 2015-05-22 LAB — CBC
HCT: 26.6 % — ABNORMAL LOW (ref 35.0–47.0)
HEMOGLOBIN: 8.2 g/dL — AB (ref 12.0–16.0)
MCH: 23.6 pg — AB (ref 26.0–34.0)
MCHC: 30.9 g/dL — ABNORMAL LOW (ref 32.0–36.0)
MCV: 76.5 fL — AB (ref 80.0–100.0)
PLATELETS: 186 10*3/uL (ref 150–440)
RBC: 3.48 MIL/uL — AB (ref 3.80–5.20)
RDW: 15.9 % — AB (ref 11.5–14.5)
WBC: 11.1 10*3/uL — ABNORMAL HIGH (ref 3.6–11.0)

## 2015-05-22 NOTE — ED Notes (Signed)
Patient requested to delay discharge until her mother could arrive. MD aware.

## 2015-05-22 NOTE — ED Provider Notes (Addendum)
[facility] Emergency Department Provider Note  Time seen: 12:47 PM  I have reviewed the triage vital signs and the nursing notes.   HISTORY  Chief Complaint No chief complaint on file.    HPI Olivia [name] is a 19 y.o. female with a past medical history of eclampsia with her prior pregnancy who presents the emergency department for evaluation. According to the patient during her last pregnancy she had a feeling that she zoned out and her vision got blurry. After that she had a seizure, and was ultimately diagnosed with eclampsia at 38 weeks.  The patient is currently pregnant with twins at 24 weeks, today she states she felt that same feeling with her vision going blurry and she felt like she was zoning out, so the patient came to the emergency department for evaluation. Patient denies any seizures or seizure-like activity.    Past Medical History  Diagnosis Date  . Seizures 10-21-2012    x 2 ; No prior history  . Chronic kidney disease     Frequent kidney stones; stent placed 2012  . Eclampsia during pregnancy     Patient Active Problem List   Diagnosis Date Noted  . Acute blood loss anemia 10/29/2012  . Flash pulmonary edema 10/27/2012  . Eclampsia 10/21/12 (seizures x 2) 10/27/2012  . S/P LTSC cesarean section 10/22/12 @ 38 wks 10/27/2012  . HTN (hypertension) 10/27/2012    Past Surgical History  Procedure Laterality Date  . Cystoscopy with stent placement  2012  . Cesarean section  10/22/2012    Procedure: CESAREAN SECTION;  Surgeon: Levi AlandMark E Anderson, MD;  Location: [facility] ORS;  Service: Obstetrics;  Laterality: N/A;  . Appendectomy      Current Outpatient Rx  Name  Route  Sig  Dispense  Refill  . acetaminophen (TYLENOL) 325 MG tablet   Oral   Take 325 mg by mouth every 6 (six) hours as needed for headache.         . bisacodyl (DULCOLAX) 5 MG EC tablet   Oral   Take 1 tablet (5 mg total) by mouth 2 (two) times daily. Patient not taking:  Reported on 10/29/2014   14 tablet   0   . FOLIC ACID PO   Oral   Take 1 tablet by mouth daily.         Marland Kitchen. ibuprofen (ADVIL,MOTRIN) 200 MG tablet   Oral   Take 400 mg by mouth every 8 (eight) hours as needed for moderate pain.          . nitrofurantoin, macrocrystal-monohydrate, (MACROBID) 100 MG capsule   Oral   Take 100 mg by mouth 2 (two) times daily.         Marland Kitchen. oxyCODONE-acetaminophen (PERCOCET/ROXICET) 5-325 MG per tablet   Oral   Take 1 tablet by mouth every 4 (four) hours as needed for pain. Patient not taking: Reported on 10/29/2014   12 tablet   0   . Prenatal Vit-Fe Fumarate-FA (PRENATAL MULTIVITAMIN) TABS tablet   Oral   Take 1 tablet by mouth daily at 12 noon.         . promethazine (PHENERGAN) 12.5 MG tablet   Oral   Take 12.5 mg by mouth every 6 (six) hours as needed for nausea or vomiting.           Allergies Review of patient's allergies indicates no known allergies.  Family History  Problem Relation Age of Onset  . Other Neg Hx   .  Hyperlipidemia Mother   . Coronary artery disease Mother 36    S/P CABG    Social History History  Substance Use Topics  . Smoking status: Former Smoker    Quit date: 01/12/2015  . Smokeless tobacco: Never Used  . Alcohol Use: Yes     Comment: not while pregnant    Review of Systems Constitutional: Negative for fever. Cardiovascular: Negative for chest pain. Respiratory: Negative for shortness of breath. Gastrointestinal: Negative for abdominal pain Genitourinary: Negative for dysuria. Musculoskeletal: Negative for back pain. Mild pedal edema. Skin: Negative for rash. Neurological: Patient states intermittent headaches. 10-point ROS otherwise negative.  ____________________________________________   PHYSICAL EXAM:  Constitutional: Alert and oriented. Well appearing and in no distress. Eyes: Normal exam, PERRL ENT   Mouth/Throat: Mucous membranes are moist. Cardiovascular: Normal rate,  regular rhythm. No murmurs Respiratory: Normal respiratory effort without tachypnea nor retractions. Breath sounds are clear  Gastrointestinal: Soft, nontender, gravid abdomen. Musculoskeletal: Nontender with normal range of motion in all extremities. Mild pedal edema bilaterally. Neurologic:  Normal speech and language. No gross focal neurologic deficits  Skin:  Skin is warm, dry and intact.  Psychiatric: Mood and affect are normal. Speech and behavior are normal.   ____________________________________________    INITIAL IMPRESSION / ASSESSMENT AND PLAN / ED COURSE  Pertinent labs & imaging results that were available during my care of the patient were reviewed by me and considered in my medical decision making (see chart for details).  Patient concern for possible preeclampsia/eclampsia so she came to the emergency department for evaluation. Blood pressures within normal limits, mild pedal edema on exam. We will check labs including LFTs, and urinalysis to help further evaluate. Patient has no abdominal pain complaints, states she continues to feel her baby is moving.  Labs are largely within normal limits, normal platelets, no LFT elevation. Patient does have a drop in her hemoglobin compared to 3 months ago. It appears to be microcytic which may be due to iron deficiency, plus a degree of dilutional effect given her pregnancy with twins. I performed a rectal exam which is shown negative results. Guaiac-negative. Patient denies any vaginal bleeding. I reviewed [facility]'s records, there does not appear to be any recent CBCs before March or comparison. Patient will follow up with her GYN. Patient agreeable plan.   Bedside ultrasound performed by myself shows good fetal movement with baby 1 and baby 2. Baby 1 has a heart rate of 143 bpm on M-mode, baby 2 has a heart rate of 150 bpm on M-mode.  ____________________________________________   FINAL CLINICAL IMPRESSION(S) / ED  DIAGNOSES  Anemia   Minna Antis, MD 05/22/15 1610  Minna Antis, MD 05/22/15 1535

## 2015-05-22 NOTE — Discharge Instructions (Signed)
Anemia, Nonspecific Anemia is a condition in which the concentration of red blood cells or hemoglobin in the blood is below normal. Hemoglobin is a substance in red blood cells that carries oxygen to the tissues of the body. Anemia results in not enough oxygen reaching these tissues.  CAUSES  Common causes of anemia include:   Excessive bleeding. Bleeding may be internal or external. This includes excessive bleeding from periods (in women) or from the intestine.   Poor nutrition.   Chronic kidney, thyroid, and liver disease.  Bone marrow disorders that decrease red blood cell production.  Cancer and treatments for cancer.  HIV, AIDS, and their treatments.  Spleen problems that increase red blood cell destruction.  Blood disorders.  Excess destruction of red blood cells due to infection, medicines, and autoimmune disorders. SIGNS AND SYMPTOMS   Minor weakness.   Dizziness.   Headache.  Palpitations.   Shortness of breath, especially with exercise.   Paleness.  Cold sensitivity.  Indigestion.  Nausea.  Difficulty sleeping.  Difficulty concentrating. Symptoms may occur suddenly or they may develop slowly.  DIAGNOSIS  Additional blood tests are often needed. These help your health care provider determine the best treatment. Your health care provider will check your stool for blood and look for other causes of blood loss.  TREATMENT  Treatment varies depending on the cause of the anemia. Treatment can include:   Supplements of iron, vitamin B12, or folic acid.   Hormone medicines.   A blood transfusion. This may be needed if blood loss is severe.   Hospitalization. This may be needed if there is significant continual blood loss.   Dietary changes.  Spleen removal. HOME CARE INSTRUCTIONS Keep all follow-up appointments. It often takes many weeks to correct anemia, and having your health care provider check on your condition and your response to  treatment is very important. SEEK IMMEDIATE MEDICAL CARE IF:   You develop extreme weakness, shortness of breath, or chest pain.   You become dizzy or have trouble concentrating.  You develop heavy vaginal bleeding.   You develop a rash.   You have bloody or black, tarry stools.   You faint.   You vomit up blood.   You vomit repeatedly.   You have abdominal pain.  You have a fever or persistent symptoms for more than 2-3 days.   You have a fever and your symptoms suddenly get worse.   You are dehydrated.  MAKE SURE YOU:  Understand these instructions.  Will watch your condition.  Will get help right away if you are not doing well or get worse. Document Released: 12/16/2004 Document Revised: 07/11/2013 Document Reviewed: 05/04/2013 [facility] Patient Information 2015 [place], [place]. This information is not intended to replace advice given to you by your health care provider. Make sure you discuss any questions you have with your health care provider.     Please follow-up with your gynecologist as soon as possible regarding today's symptoms in your emergency department visit. As we have discussed the results today are largely within normal limits besides a decreased hemoglobin of 8.2. Please return to the emergency department for any personally concerning symptoms.

## 2015-05-22 NOTE — ED Notes (Addendum)
Per EMS report, patient is [redacted] weeks pregnant with twins, has a history of seizures and eclampsia in previous pregnancy and had a feeling of impending seizure today. Patient states she has had severe headaches over the past 3-4 weeks and at the MD's appointment she had protein in her urine. MD also put her medicine for the headache but patient denies relief. Patient states her vision was blurry and just felt out of it earlier, but those feelings have resolved. BS fingerstick was 74.

## 2015-07-27 DIAGNOSIS — O1490 Unspecified pre-eclampsia, unspecified trimester: Secondary | ICD-10-CM

## 2015-10-08 ENCOUNTER — Emergency Department (HOSPITAL_COMMUNITY)
Admission: EM | Admit: 2015-10-08 | Discharge: 2015-10-09 | Disposition: A | Discharge status: Home / Self Care | Payer: Medicaid Other | Attending: Emergency Medicine | Admitting: Emergency Medicine

## 2015-10-08 ENCOUNTER — Encounter (HOSPITAL_COMMUNITY): Payer: Self-pay | Admitting: *Deleted

## 2015-10-08 DIAGNOSIS — F17200 Nicotine dependence, unspecified, uncomplicated: Secondary | ICD-10-CM | POA: Insufficient documentation

## 2015-10-08 DIAGNOSIS — G40909 Epilepsy, unspecified, not intractable, without status epilepticus: Secondary | ICD-10-CM | POA: Diagnosis not present

## 2015-10-08 DIAGNOSIS — F3170 Bipolar disorder, currently in remission, most recent episode unspecified: Secondary | ICD-10-CM

## 2015-10-08 DIAGNOSIS — Z008 Encounter for other general examination: Secondary | ICD-10-CM | POA: Diagnosis present

## 2015-10-08 DIAGNOSIS — Z87442 Personal history of urinary calculi: Secondary | ICD-10-CM | POA: Insufficient documentation

## 2015-10-08 DIAGNOSIS — F319 Bipolar disorder, unspecified: Secondary | ICD-10-CM | POA: Insufficient documentation

## 2015-10-08 DIAGNOSIS — Z79899 Other long term (current) drug therapy: Secondary | ICD-10-CM | POA: Diagnosis not present

## 2015-10-08 DIAGNOSIS — N189 Chronic kidney disease, unspecified: Secondary | ICD-10-CM | POA: Insufficient documentation

## 2015-10-08 HISTORY — DX: Bipolar disorder, unspecified: F31.9

## 2015-10-08 NOTE — ED Notes (Signed)
Patient presents stating she wants to be assessed.  Just dx as bipolar (2 weeks ago) and thinks her meds are not helping.  Got prescription for new meds but did not get them filled yet.

## 2015-10-08 NOTE — ED Notes (Signed)
Patient denies SI or HI 

## 2015-10-09 ENCOUNTER — Encounter (HOSPITAL_COMMUNITY): Payer: Self-pay | Admitting: Emergency Medicine

## 2015-10-09 ENCOUNTER — Emergency Department (HOSPITAL_COMMUNITY)
Admission: EM | Admit: 2015-10-09 | Discharge: 2015-10-09 | Disposition: A | Discharge status: Home / Self Care | Payer: Medicaid Other | Source: Home / Self Care | Attending: Emergency Medicine | Admitting: Emergency Medicine

## 2015-10-09 DIAGNOSIS — F319 Bipolar disorder, unspecified: Secondary | ICD-10-CM

## 2015-10-09 LAB — COMPREHENSIVE METABOLIC PANEL
ALBUMIN: 4 g/dL (ref 3.5–5.0)
ALK PHOS: 68 U/L (ref 38–126)
ALT: 23 U/L (ref 14–54)
ANION GAP: 6 (ref 5–15)
AST: 27 U/L (ref 15–41)
BILIRUBIN TOTAL: 0.4 mg/dL (ref 0.3–1.2)
BUN: 11 mg/dL (ref 6–20)
CALCIUM: 9 mg/dL (ref 8.9–10.3)
CO2: 25 mmol/L (ref 22–32)
Chloride: 108 mmol/L (ref 101–111)
Creatinine, Ser: 0.87 mg/dL (ref 0.44–1.00)
GFR calc non Af Amer: >60 mL/min (ref >60)
GLUCOSE: 88 mg/dL (ref 65–99)
Potassium: 4.2 mmol/L (ref 3.5–5.1)
Sodium: 139 mmol/L (ref 135–145)
TOTAL PROTEIN: 7.2 g/dL (ref 6.5–8.1)

## 2015-10-09 LAB — CBC
HEMATOCRIT: 38.6 % (ref 36.0–46.0)
Hemoglobin: 12.1 g/dL (ref 12.0–15.0)
MCH: 25.7 pg — ABNORMAL LOW (ref 26.0–34.0)
MCHC: 31.3 g/dL (ref 30.0–36.0)
MCV: 82 fL (ref 78.0–100.0)
Platelets: 285 10*3/uL (ref 150–400)
RBC: 4.71 MIL/uL (ref 3.87–5.11)
RDW: 16.5 % — AB (ref 11.5–15.5)
WBC: 7.4 10*3/uL (ref 4.0–10.5)

## 2015-10-09 LAB — ACETAMINOPHEN LEVEL

## 2015-10-09 LAB — RAPID URINE DRUG SCREEN, HOSP PERFORMED
Amphetamines: NOT DETECTED
BARBITURATES: NOT DETECTED
Benzodiazepines: POSITIVE — AB
COCAINE: NOT DETECTED
Opiates: NOT DETECTED
Tetrahydrocannabinol: NOT DETECTED

## 2015-10-09 LAB — SALICYLATE LEVEL: Salicylate Lvl: <4 mg/dL (ref 2.8–30.0)

## 2015-10-09 LAB — CBG MONITORING, ED: GLUCOSE-CAPILLARY: 74 mg/dL (ref 65–99)

## 2015-10-09 LAB — ETHANOL: Alcohol, Ethyl (B): <5 mg/dL (ref <5)

## 2015-10-09 MED ORDER — ALUM & MAG HYDROXIDE-SIMETH 200-200-20 MG/5ML PO SUSP: 30.0000 mL | ORAL | Status: DC | PRN

## 2015-10-09 MED ORDER — ZOLPIDEM TARTRATE 5 MG PO TABS: 5.0000 mg | ORAL_TABLET | Freq: Every evening | ORAL | Status: DC | PRN

## 2015-10-09 MED ORDER — ACETAMINOPHEN 325 MG PO TABS: 650.0000 mg | ORAL_TABLET | ORAL | Status: DC | PRN

## 2015-10-09 MED ORDER — NICOTINE 21 MG/24HR TD PT24: 21.0000 mg | MEDICATED_PATCH | Freq: Every day | TRANSDERMAL | Status: DC | PRN

## 2015-10-09 MED ORDER — ONDANSETRON HCL 4 MG PO TABS: 4.0000 mg | ORAL_TABLET | Freq: Three times a day (TID) | ORAL | Status: DC | PRN

## 2015-10-09 MED ORDER — IBUPROFEN 200 MG PO TABS: 600.0000 mg | ORAL_TABLET | Freq: Three times a day (TID) | ORAL | Status: DC | PRN

## 2015-10-09 NOTE — ED Notes (Signed)
[facility] to follow up OP.  Went home and "tried to overdose on klonopin".  States that she only took 1 pill and decided not to take the other 5 that she had in her hand.  States that the pills were 0.5 mg.  No HI.  Denies substance abuse.

## 2015-10-09 NOTE — BH Assessment (Signed)
Tele Assessment Note   Olivia [name] is a 19 y.o. female who voluntarily presents to [facility] for a psych eval for medications.  Pt denies SI/HI/AVH. Pt states she came to emerg dept because she was diagnosed with Bipolar D/O, two weeks ago and the medications are not working.  She says that her mood swings have gotten worse and she is unable to control them.  Pt states she was prescribed a new medication on 10/08/15(Lamictal)  and is to begin the medication in the morning, however she is afraid that the medication will not work fast enough to help control her mood swings.  Pt stats she was initially prescribed Zoloft, then Wellbutrin which didn't help.  Pt is currently seeing Dr. Marlyne Beards at [facility] for medications mgt.  This Clinical research associate consulted with Donell Sievert, PA who recommends discharge for patient as she meets no criteria for inpatient admission and that she return to current provider for follow up and continued mgt.    Diagnosis: Axis I: 296.7 Bipolar I disorder, Current or most recent episode unspecified  Past Medical History:  Past Medical History  Diagnosis Date  . Seizures (HCC) 10-21-2012    x 2 ; No prior history  . Chronic kidney disease     Frequent kidney stones; stent placed 2012  . Eclampsia during pregnancy   . Bipolar 1 disorder [facility])     Past Surgical History  Procedure Laterality Date  . Cystoscopy with stent placement  2012  . Cesarean section  10/22/2012    Procedure: CESAREAN SECTION;  Surgeon: Levi Aland, MD;  Location: [facility] ORS;  Service: Obstetrics;  Laterality: N/A;  . Appendectomy      Family History:  Family History  Problem Relation Age of Onset  . Other Neg Hx   . Hyperlipidemia Mother   . Coronary artery disease Mother 66    S/P CABG    Social History:  reports that she has been smoking.  She has never used smokeless tobacco. She reports that she does not drink alcohol or use illicit drugs.  Additional Social History:  Alcohol / Drug Use Pain  Medications: See MAR  Prescriptions: See MAR  Over the Counter: See MAR  History of alcohol / drug use?: No history of alcohol / drug abuse Longest period of sobriety (when/how long): None   CIWA: CIWA-Ar BP: 110/67 mmHg Pulse Rate: 88 COWS:    PATIENT STRENGTHS: (choose at least two) Communication skills Motivation for treatment/growth  Allergies: No Known Allergies  Home Medications:  (Not in a hospital admission)  OB/GYN Status:  Patient's last menstrual period was 09/15/2015.  General Assessment Data Location of Assessment[facility] ED TTS Assessment: In system Is this a Tele or Face-to-Face Assessment?: Tele Assessment Is this an Initial Assessment or a Re-assessment for this encounter?: Initial Assessment Marital status: Single Maiden name: Steeber  Is patient pregnant?: No Pregnancy Status: No Living Arrangements: Alone Can pt return to current living arrangement?: Yes Admission Status: Voluntary Is patient capable of signing voluntary admission?: Yes Referral Source: MD Insurance type: MCD  Medical Screening Exam [facility] Walk-in ONLY) Medical Exam completed: No Reason for MSE not completed: Other: (None )  Crisis Care Plan Living Arrangements: Alone Name of Psychiatrist: Dr. August Luz  Name of Therapist: None   Education Status Is patient currently in school?: No Current Grade: None  Highest grade of school patient has completed: None  Name of school: None  Contact person: None   Risk to self with the past 6  months Suicidal Ideation: No Has patient been a risk to self within the past 6 months prior to admission? : No Suicidal Intent: No Has patient had any suicidal intent within the past 6 months prior to admission? : No Is patient at risk for suicide?: No Suicidal Plan?: No Has patient had any suicidal plan within the past 6 months prior to admission? : No Access to Means: No What has been your use of drugs/alcohol within the last 12 months?:  Pt denies  Previous Attempts/Gestures: No How many times?: 0 Other Self Harm Risks: None  Triggers for Past Attempts: None known Intentional Self Injurious Behavior: None Family Suicide History: No Recent stressful life event(s): Other (Comment) (Worsening mood swings and recent medication changes) Persecutory voices/beliefs?: No Depression: Yes Depression Symptoms: Loss of interest in usual pleasures Substance abuse history and/or treatment for substance abuse?: No Suicide prevention information given to non-admitted patients: Not applicable  Risk to Others within the past 6 months Homicidal Ideation: No Does patient have any lifetime risk of violence toward others beyond the six months prior to admission? : No Thoughts of Harm to Others: No Current Homicidal Intent: No Current Homicidal Plan: No Access to Homicidal Means: No Identified Victim: None History of harm to others?: No Assessment of Violence: None Noted Violent Behavior Description: None  Does patient have access to weapons?: No Criminal Charges Pending?: No Does patient have a court date: No Is patient on probation?: No  Psychosis Hallucinations: None noted Delusions: None noted  Mental Status Report Appearance/Hygiene: Unable to Assess Eye Contact: Unable to Assess Motor Activity: Unremarkable Speech: Logical/coherent, Soft Level of Consciousness: Alert Mood: Other (Comment) (Appropriate ) Affect: Unable to Assess Anxiety Level: Minimal Thought Processes: Coherent, Relevant Judgement: Unimpaired Orientation: Person, Place, Time, Situation Obsessive Compulsive Thoughts/Behaviors: None  Cognitive Functioning Concentration: Normal Memory: Recent Intact, Remote Intact IQ: Average Insight: Fair Impulse Control: Good Appetite: Fair Weight Loss: 0 Weight Gain: 0 Sleep: No Change Total Hours of Sleep: 5 Vegetative Symptoms: None  ADLScreening [facility]) Patient's cognitive ability  adequate to safely complete daily activities?: Yes Patient able to express need for assistance with ADLs?: Yes Independently performs ADLs?: Yes (appropriate for developmental age)  Prior Inpatient Therapy Prior Inpatient Therapy: No Prior Therapy Dates: None  Prior Therapy Facilty/Provider(s): None  Reason for Treatment: None   Prior Outpatient Therapy Prior Outpatient Therapy: Yes Prior Therapy Dates: Current  Prior Therapy Facilty/Provider(s): [facility]  Reason for Treatment: Med Mgt  Does patient have an ACCT team?: No Does patient have Intensive In-House Services?  : No Does patient have [facility] services? : No Does patient have P4CC services?: No  ADL Screening (condition at time of admission) Patient's cognitive ability adequate to safely complete daily activities?: Yes Is the patient deaf or have difficulty hearing?: No Does the patient have difficulty seeing, even when wearing glasses/contacts?: No Does the patient have difficulty concentrating, remembering, or making decisions?: No Patient able to express need for assistance with ADLs?: Yes Does the patient have difficulty dressing or bathing?: No Independently performs ADLs?: Yes (appropriate for developmental age) Does the patient have difficulty walking or climbing stairs?: No Weakness of Legs: None Weakness of Arms/Hands: None  Home Assistive Devices/Equipment Home Assistive Devices/Equipment: None  Therapy Consults (therapy consults require a physician order) PT Evaluation Needed: No OT Evalulation Needed: No SLP Evaluation Needed: No Abuse/Neglect Assessment (Assessment to be complete while patient is alone) Physical Abuse: Denies Verbal Abuse: Denies Sexual Abuse: Denies Exploitation of patient/patient's  resources: Denies Self-Neglect: Denies Values / Beliefs Cultural Requests During Hospitalization: None Spiritual Requests During Hospitalization: None Consults Spiritual Care Consult  Needed: No Social Work Consult Needed: No Merchant navy officer (For Healthcare) Does patient have an advance directive?: No Would patient like information on creating an advanced directive?: No - patient declined information    Additional Information 1:1 In Past 12 Months?: No CIRT Risk: No Elopement Risk: No Does patient have medical clearance?: Yes     Disposition:  Disposition Initial Assessment Completed for this Encounter: Yes Disposition of Patient: Outpatient treatment, Referred to (Per Donell Sievert, PA meets no criteria for inpt admission ) Type of outpatient treatment: Adult (Per Donell Sievert, PA meets criteria for inpt admission ) Patient referred to: Other (Comment) (Per Donell Sievert, PA meets no criteria for inpt admission )  Murrell Redden 10/09/2015 2:15 AM

## 2015-10-09 NOTE — ED Notes (Signed)
Pt completed a phone [facility]

## 2015-10-09 NOTE — ED Notes (Signed)
[facility] Expected date:  Expected time:  Means of arrival:  Comments: Hold for triage 8

## 2015-10-09 NOTE — Discharge Instructions (Signed)
Bipolar Disorder °Bipolar disorder is a mental illness. The term bipolar disorder actually is used to describe a group of disorders that all share varying degrees of emotional highs and lows that can interfere with daily functioning, such as work, school, or relationships. Bipolar disorder also can lead to drug abuse, hospitalization, and suicide. °The emotional highs of bipolar disorder are periods of elation or irritability and high energy. These highs can range from a mild form (hypomania) to a severe form (mania). People experiencing episodes of hypomania may appear energetic, excitable, and highly productive. People experiencing mania may behave impulsively or erratically. They often make poor decisions. They may have difficulty sleeping. The most severe episodes of mania can involve having very distorted beliefs or perceptions about the world and seeing or hearing things that are not real (psychotic delusions and hallucinations).  °The emotional lows of bipolar disorder (depression) also can range from mild to severe. Severe episodes of bipolar depression can involve psychotic delusions and hallucinations. °Sometimes people with bipolar disorder experience a state of mixed mood. Symptoms of hypomania or mania and depression are both present during this mixed-mood episode. °SIGNS AND SYMPTOMS °There are signs and symptoms of the episodes of hypomania and mania as well as the episodes of depression. The signs and symptoms of hypomania and mania are similar but vary in severity. They include: °· Inflated self-esteem or feeling of increased self-confidence. °· Decreased need for sleep. °· Unusual talkativeness (rapid or pressured speech) or the feeling of a need to keep talking. °· Sensation of racing thoughts or constant talking, with quick shifts between topics that may or may not be related (flight of ideas). °· Decreased ability to focus or concentrate. °· Increased purposeful activity, such as work, studies,  or social activity, or nonproductive activity, such as pacing, squirming and fidgeting, or finger and toe tapping. °· Impulsive behavior and use of poor judgment, resulting in high-risk activities, such as having unprotected sex or spending excessive amounts of money. °Signs and symptoms of depression include the following:  °· Feelings of sadness, hopelessness, or helplessness. °· Frequent or uncontrollable episodes of crying. °· Lack of feeling anything or caring about anything. °· Difficulty sleeping or sleeping too much.  °· Inability to enjoy the things you used to enjoy.   °· Desire to be alone all the time.   °· Feelings of guilt or worthlessness.  °· Lack of energy or motivation.   °· Difficulty concentrating, remembering, or making decisions.  °· Change in appetite or weight beyond normal fluctuations. °· Thoughts of death or the desire to harm yourself. °DIAGNOSIS  °Bipolar disorder is diagnosed through an assessment by your caregiver. Your caregiver will ask questions about your emotional episodes. There are two main types of bipolar disorder. People with type I bipolar disorder have manic episodes with or without depressive episodes. People with type II bipolar disorder have hypomanic episodes and major depressive episodes, which are more serious than mild depression. The type of bipolar disorder you have can make an important difference in how your illness is monitored and treated. °Your caregiver may ask questions about your medical history and use of alcohol or drugs, including prescription medication. Certain medical conditions and substances also can cause emotional highs and lows that resemble bipolar disorder (secondary bipolar disorder).  °TREATMENT  °Bipolar disorder is a long-term illness. It is best controlled with continuous treatment rather than treatment only when symptoms occur. The following treatments can be prescribed for bipolar disorders: °· Medication--Medication can be prescribed by  a doctor that   is an expert in treating mental disorders (psychiatrists). Medications called mood stabilizers are usually prescribed to help control the illness. Other medications are sometimes added if symptoms of mania, depression, or psychotic delusions and hallucinations occur despite the use of a mood stabilizer. °· Talk therapy--Some forms of talk therapy are helpful in providing support, education, and guidance. °A combination of medication and talk therapy is best for managing the disorder over time. A procedure in which electricity is applied to your brain through your scalp (electroconvulsive therapy) is used in cases of severe mania when medication and talk therapy do not work or work too slowly. °  °This information is not intended to replace advice given to you by your health care provider. Make sure you discuss any questions you have with your health care provider. °  °Document Released: 02/14/2001 Document Revised: 11/29/2014 Document Reviewed: 12/04/2012 °Elsevier Interactive Patient Education ©2016 [employer] ° °

## 2015-10-09 NOTE — BH Assessment (Signed)
Tele Assessment Note   Olivia [name] is an 19 y.o. female.  -Clinician reviewed note by Dr. Effie Shy.  Patient had been discharged from [facility] this morning after being cleared by [facility] to continue with outpatient resources.  Patient got into argument w/ bf (also father of twins born two weeks ago).  She took a klonopin (. ) and threatened to take four more.  Patient currently denies any suicidal ideations or intention or plan.  No HI or A/V hallucinations.  Patient said that this afternoon she had gotten into argument with boyfriend and took one of the klonopin to help her calm herself.  Patient then told boyfriend she would take the other four tablets.  Patient said that she did not mean it.  She was trying to get him upset.  Patient looked guilty about having done this.  Patient says that she never had any intention to try to actually carry through with the threat.  Patient has no prior hx of attempts.  Patient said that she just started on prescription of Abilify today.  Patient is willing to contract for safety.  Patient says that she can stay with her mother and stepfather tonight.  Clinician talked with Dr. Effie Shy and he is okay with patient signing a no harm contract and staying with another responsible adult.  Patient also said that she wants to get set up with counseling services at [facility].  Patient did sign a Energy manager.  Patient had her stepfather talk to the nurse to confirm that patient will be staying with him and patient's mother.  Pt was discharged home and will be following up with [facility] to get an appointment set up w/ a counselor there.  Diagnosis:  Axis 1: 296.7 Bipolar I d/o, most recent episode unspecified. Axis 2: Deferred Axis 3: See H&P Axis 4: psychosocial stressors Axis 5: GAF 53  Past Medical History:  Past Medical History  Diagnosis Date  . Seizures (HCC) 10-21-2012    x 2 ; No prior history  . Chronic kidney disease     Frequent kidney stones; stent  placed 2012  . Eclampsia during pregnancy   . Bipolar 1 disorder [facility])     Past Surgical History  Procedure Laterality Date  . Cystoscopy with stent placement  2012  . Cesarean section  10/22/2012    Procedure: CESAREAN SECTION;  Surgeon: Levi Aland, MD;  Location: [facility] ORS;  Service: Obstetrics;  Laterality: N/A;  . Appendectomy      Family History:  Family History  Problem Relation Age of Onset  . Other Neg Hx   . Hyperlipidemia Mother   . Coronary artery disease Mother 61    S/P CABG    Social History:  reports that she has been smoking.  She has never used smokeless tobacco. She reports that she does not drink alcohol or use illicit drugs.  Additional Social History:  Alcohol / Drug Use Pain Medications: See PTA medication list Prescriptions: see PTA medication list Over the Counter: See PTA medication list. History of alcohol / drug use?: No history of alcohol / drug abuse  CIWA: CIWA-Ar BP: 109/74 mmHg Pulse Rate: 86 COWS:    PATIENT STRENGTHS: (choose at least two) Average or above average intelligence Capable of independent living Communication skills Motivation for treatment/growth Supportive family/friends  Allergies: No Known Allergies  Home Medications:  (Not in a hospital admission)  OB/GYN Status:  Patient's last menstrual period was 09/15/2015.  General Assessment Data Location of Assessment: [facility] ED  TTS Assessment: In system Is this a Tele or Face-to-Face Assessment?: Face-to-Face Is this an Initial Assessment or a Re-assessment for this encounter?: Initial Assessment Marital status: Single Is patient pregnant?: No Pregnancy Status: Other (Comment) (Has three children.  Twins born two weeks ago.) Living Arrangements: Alone Can pt return to current living arrangement?: Yes Admission Status: Voluntary Is patient capable of signing voluntary admission?: Yes Referral Source: Self/Family/Friend Insurance type: MCD     Crisis Care  Plan Living Arrangements: Alone Name of Psychiatrist: Dr. August Luz  Name of Therapist: None  (Wants to get a therapist at [facility].)  Education Status Is patient currently in school?: No  Risk to self with the past 6 months Suicidal Ideation: No Has patient been a risk to self within the past 6 months prior to admission? : No Suicidal Intent: No Has patient had any suicidal intent within the past 6 months prior to admission? : No Is patient at risk for suicide?: No Suicidal Plan?: No Has patient had any suicidal plan within the past 6 months prior to admission? : No Access to Means: No What has been your use of drugs/alcohol within the last 12 months?: Denies Previous Attempts/Gestures: No How many times?: 0 Other Self Harm Risks: None Triggers for Past Attempts: None known Intentional Self Injurious Behavior: None Family Suicide History: No Recent stressful life event(s): Other (Comment) (Mood changes.  Recent birth of twins.) Persecutory voices/beliefs?: No Depression: Yes Depression Symptoms: Loss of interest in usual pleasures Substance abuse history and/or treatment for substance abuse?: No Suicide prevention information given to non-admitted patients: Not applicable  Risk to Others within the past 6 months Homicidal Ideation: No Does patient have any lifetime risk of violence toward others beyond the six months prior to admission? : No Thoughts of Harm to Others: No Current Homicidal Intent: No Current Homicidal Plan: No Access to Homicidal Means: No Identified Victim: No one History of harm to others?: No Assessment of Violence: None Noted Violent Behavior Description: None Does patient have access to weapons?: No Criminal Charges Pending?: No Does patient have a court date: No Is patient on probation?: No  Psychosis Hallucinations: None noted Delusions: None noted  Mental Status Report Appearance/Hygiene: Unremarkable, In scrubs Eye Contact:  Good Motor Activity: Freedom of movement, Unremarkable Speech: Logical/coherent, Soft Level of Consciousness: Alert Mood: Pleasant Affect: Appropriate to circumstance Anxiety Level: Minimal Thought Processes: Coherent, Relevant Judgement: Unimpaired Orientation: Person, Place, Time, Situation Obsessive Compulsive Thoughts/Behaviors: None  Cognitive Functioning Concentration: Normal Memory: Recent Intact, Remote Intact IQ: Average Insight: Fair Impulse Control: Good Appetite: Fair Weight Loss: 0 Weight Gain: 0 Sleep: No Change Total Hours of Sleep: 5 Vegetative Symptoms: None  ADLScreening [facility] Assessment Services) Patient's cognitive ability adequate to safely complete daily activities?: Yes Patient able to express need for assistance with ADLs?: Yes Independently performs ADLs?: Yes (appropriate for developmental age)  Prior Inpatient Therapy Prior Inpatient Therapy: No Prior Therapy Dates: None  Prior Therapy Facilty/Provider(s): None  Reason for Treatment: None   Prior Outpatient Therapy Prior Outpatient Therapy: Yes Prior Therapy Dates: Current  Prior Therapy Facilty/Provider(s): Dr. August Luz  Reason for Treatment: Med Mgt  Does patient have an ACCT team?: No Does patient have Intensive In-House Services?  : No Does patient have [facility] services? : No Does patient have P4CC services?: No  ADL Screening (condition at time of admission) Patient's cognitive ability adequate to safely complete daily activities?: Yes Is the patient deaf or have difficulty hearing?: No Does the patient have difficulty  seeing, even when wearing glasses/contacts?: No Does the patient have difficulty concentrating, remembering, or making decisions?: No Patient able to express need for assistance with ADLs?: Yes Does the patient have difficulty dressing or bathing?: No Independently performs ADLs?: Yes (appropriate for developmental age) Does the patient have difficulty  walking or climbing stairs?: No Weakness of Legs: None Weakness of Arms/Hands: None       Abuse/Neglect Assessment (Assessment to be complete while patient is alone) Physical Abuse: Denies Verbal Abuse: Denies Sexual Abuse: Denies Exploitation of patient/patient's resources: Denies Self-Neglect: Denies     Merchant navy officerAdvance Directives (For Healthcare) Does patient have an advance directive?: No Would patient like information on creating an advanced directive?: No - patient declined information    Additional Information 1:1 In Past 12 Months?: No CIRT Risk: No Elopement Risk: No Does patient have medical clearance?: Yes     Disposition:  Disposition Initial Assessment Completed for this Encounter: Yes Disposition of Patient: Outpatient treatment Type of outpatient treatment: Adult Patient referred to: Other (Comment) (Referred to current provider)  Olivia [name], Olivia [name] 10/09/2015 9:57 PM

## 2015-10-09 NOTE — ED Provider Notes (Signed)
CSN: [id number]     Arrival date & time 10/09/15  1427 History   First MD Initiated Contact with Patient 10/09/15 1509     Chief Complaint  Patient presents with  . Suicidal     (Consider location/radiation/quality/duration/timing/severity/associated sxs/prior Treatment) HPI  Olivia [name] is a 19 y.o. female who presents for evaluation of threatening to kill self by taking Klonopin pills. She states that she was arguing with her children's father, whereupon she took a Klonopin pill, and threatened to take four more. She did not do this or take anything else. She was in the emergency department last night, and during the visit. Talk to behavioral health. Since leaving, she took her first dose of Abilify, then got in an argument with her boyfriend. She is currently seeing a therapist, and plans to schedule some more visits. She denies active suicidal plan. No recent medical illness. She delivered twins, 2 months ago, by cesarean section. There are no other known modifying factors.   Past Medical History  Diagnosis Date  . Seizures (HCC) 10-21-2012    x 2 ; No prior history  . Chronic kidney disease     Frequent kidney stones; stent placed 2012  . Eclampsia during pregnancy   . Bipolar 1 disorder [facility](HCC)    Past Surgical History  Procedure Laterality Date  . Cystoscopy with stent placement  2012  . Cesarean section  10/22/2012    Procedure: CESAREAN SECTION;  Surgeon: Levi AlandMark E Anderson, MD;  Location: [facility] ORS;  Service: Obstetrics;  Laterality: N/A;  . Appendectomy     Family History  Problem Relation Age of Onset  . Other Neg Hx   . Hyperlipidemia Mother   . Coronary artery disease Mother [id number]    S/P CABG   Social History  Substance Use Topics  . Smoking status: Current Some Day Smoker    Last Attempt to Quit: 01/12/2015  . Smokeless tobacco: Never Used  . Alcohol Use: No     Comment: not while pregnant   OB History    Gravida Para Term Preterm AB TAB SAB Ectopic Multiple  Living   2 1 1  0 0 0 0 0 0 1     Review of Systems  All other systems reviewed and are negative.     Allergies  Review of patient's allergies indicates no known allergies.  Home Medications   Prior to Admission medications   Medication Sig Start Date End Date Taking? Authorizing Provider  ARIPiprazole (ABILIFY) 5 MG tablet Take 2.5 mg by mouth daily.   Yes Historical Provider, MD  clonazePAM (KLONOPIN) 0.5 MG tablet Take 0.5 mg by mouth 2 (two) times daily as needed for anxiety.   Yes Historical Provider, MD  lamoTRIgine (LAMICTAL) 25 MG tablet Take 25 mg by mouth at bedtime. 10/01/15  Yes Historical Provider, MD  bisacodyl (DULCOLAX) 5 MG EC tablet Take 1 tablet (5 mg total) by mouth 2 (two) times daily. Patient not taking: Reported on 10/29/2014 06/15/13   Marlon Peliffany Greene, PA-C  oxyCODONE-acetaminophen (PERCOCET/ROXICET) 5-325 MG per tablet Take 1 tablet by mouth every 4 (four) hours as needed for pain. Patient not taking: Reported on 10/29/2014 06/12/13   Carleene CooperAlan Davidson, MD   LMP 09/15/2015 Physical Exam  Constitutional: She is oriented to person, place, and time. She appears well-developed and well-nourished.  HENT:  Head: Normocephalic and atraumatic.  Right Ear: External ear normal.  Left Ear: External ear normal.  Eyes: Conjunctivae and EOM are normal. Pupils are equal, round,  and reactive to light.  Neck: Normal range of motion and phonation normal. Neck supple.  Cardiovascular: Normal rate.   Pulmonary/Chest: Effort normal. She exhibits no bony tenderness.  Musculoskeletal: Normal range of motion.  Neurological: She is alert and oriented to person, place, and time. No cranial nerve deficit or sensory deficit. She exhibits normal muscle tone. Coordination normal.  Skin: Skin is warm, dry and intact.  Psychiatric: She has a normal mood and affect. Her behavior is normal. Judgment and thought content normal.  Nursing note and vitals reviewed.   ED Course  Procedures  (including critical care time)  Medications  acetaminophen (TYLENOL) tablet 650 mg (not administered)  ibuprofen (ADVIL,MOTRIN) tablet 600 mg (not administered)  zolpidem (AMBIEN) tablet 5 mg (not administered)  nicotine (NICODERM CQ - dosed in mg/24 hours) patch 21 mg (not administered)  ondansetron (ZOFRAN) tablet 4 mg (not administered)  alum & mag hydroxide-simeth (MAALOX/MYLANTA) 200-200-20 MG/5ML suspension 30 mL (not administered)    No data found.  TTS. Consultation completed  8:40 PM Reevaluation with update and discussion. After initial assessment and treatment, an updated evaluation reveals patient remains comfortable, cooperative. She has signed a no harm contract. She expresses no further problems.Mancel Bale L    Labs Review Labs Reviewed  ACETAMINOPHEN LEVEL - Abnormal; Notable for the following:    Acetaminophen (Tylenol), Serum <10 (*)    All other components within normal limits  CBC - Abnormal; Notable for the following:    MCH 25.7 (*)    RDW 16.5 (*)    All other components within normal limits  URINE RAPID DRUG SCREEN, HOSP PERFORMED - Abnormal; Notable for the following:    Benzodiazepines POSITIVE (*)    All other components within normal limits  COMPREHENSIVE METABOLIC PANEL  ETHANOL  SALICYLATE LEVEL  CBG MONITORING, ED    Imaging Review No results found. I have personally reviewed and evaluated these images and lab results as part of my medical decision-making.   EKG Interpretation None      MDM   Final diagnoses:  Bipolar disorder, unspecified (HCC)    Bipolar disorder, with psychosocial stressors. She ingested a single Klonopin, which she usually takes for anxiety. She admitted to TTS that she was "trying to get a rise out of her boyfriend". Doubt risk for suicide, acute psychosis, or significant depression.   Nursing Notes Reviewed/ Care Coordinated Applicable Imaging Reviewed Interpretation of Laboratory Data incorporated into  ED treatment  The patient appears reasonably screened and/or stabilized for discharge and I doubt any other medical condition or other [facility] requiring further screening, evaluation, or treatment in the ED at this time prior to discharge.  Plan: Home Medications- usual; Home Treatments- rest; return here if the recommended treatment, does not improve the symptoms; Recommended follow up- PCP prn   Mancel Bale, MD 10/09/15 2042

## 2015-10-09 NOTE — ED Provider Notes (Signed)
CSN: [id number]     Arrival date & time 10/08/15  2208 History   First MD Initiated Contact with Patient 10/08/15 2348     Chief Complaint  Patient presents with  . Medical Clearance     (Consider location/radiation/quality/duration/timing/severity/associated sxs/prior Treatment) HPI Comments: 19 year old female presents to the emergency department for further evaluation of her bipolar 1 disorder. Patient reports that she was on Zoloft prescribed by her primary care doctor. She began seeing a psychiatrist who discontinued her Zoloft and put her on Wellbutrin. Patient states that Zoloft never helped her bipolar disorder and she feels as though the Wellbutrin has made it worse. Her psychiatrist has since changed her medications, but she has not taken this medication because she is concerned that it will not help her. She came to the emergency department today hoping for behavioral health hospitalization for further management of her bipolar disorder. She denies any suicidal or homicidal ideations. No alcohol or illicit drug use.  The history is provided by the patient. No language interpreter was used.    Past Medical History  Diagnosis Date  . Seizures (HCC) 10-21-2012    x 2 ; No prior history  . Chronic kidney disease     Frequent kidney stones; stent placed 2012  . Eclampsia during pregnancy   . Bipolar 1 disorder [facility](HCC)    Past Surgical History  Procedure Laterality Date  . Cystoscopy with stent placement  2012  . Cesarean section  10/22/2012    Procedure: CESAREAN SECTION;  Surgeon: Levi AlandMark E Anderson, MD;  Location: [facility];  Service: Obstetrics;  Laterality: N/A;  . Appendectomy     Family History  Problem Relation Age of Onset  . Other Neg Hx   . Hyperlipidemia Mother   . Coronary artery disease Mother 2435    S/P CABG   Social History  Substance Use Topics  . Smoking status: Current Some Day Smoker    Last Attempt to Quit: 01/12/2015  . Smokeless tobacco: Never Used  .  Alcohol Use: No     Comment: not while pregnant   OB History    Gravida Para Term Preterm AB TAB SAB Ectopic Multiple Living   2 1 1  0 0 0 0 0 0 1      Review of Systems  Psychiatric/Behavioral: Positive for behavioral problems and agitation.  All other systems reviewed and are negative.   Allergies  Review of patient's allergies indicates no known allergies.  Home Medications   Prior to Admission medications   Medication Sig Start Date End Date Taking? Authorizing Provider  clonazePAM (KLONOPIN) 0.5 MG tablet Take 0.5 mg by mouth 2 (two) times daily as needed for anxiety.   Yes Historical Provider, MD  lamoTRIgine (LAMICTAL) 25 MG tablet Take 25 mg by mouth at bedtime. 10/01/15  Yes Historical Provider, MD  bisacodyl (DULCOLAX) 5 MG EC tablet Take 1 tablet (5 mg total) by mouth 2 (two) times daily. Patient not taking: Reported on 10/29/2014 06/15/13   Marlon Peliffany Greene, PA-C  oxyCODONE-acetaminophen (PERCOCET/ROXICET) 5-325 MG per tablet Take 1 tablet by mouth every 4 (four) hours as needed for pain. Patient not taking: Reported on 10/29/2014 06/12/13   Carleene CooperAlan Davidson, MD   BP 108/63 mmHg  Pulse 78  Temp(Src) 98.3 F (36.8 C) (Oral)  Resp 20  Ht 5\' 1"  (1.549 m)  Wt 102 lb (46.267 kg)  BMI 19.28 kg/m2  SpO2 97%  LMP 09/15/2015  Breastfeeding? Unknown   Physical Exam  Constitutional: She is  oriented to person, place, and time. She appears well-developed and well-nourished. No distress.  HENT:  Head: Normocephalic and atraumatic.  Eyes: Conjunctivae and EOM are normal. No scleral icterus.  Neck: Normal range of motion.  Pulmonary/Chest: Effort normal. No respiratory distress.  Musculoskeletal: Normal range of motion.  Neurological: She is alert and oriented to person, place, and time. She exhibits normal muscle tone. Coordination normal.  Skin: Skin is warm and dry. No rash noted. She is not diaphoretic. No erythema. No pallor.  Psychiatric: She has a normal mood and affect.  Her speech is normal and behavior is normal.  NO SI/HI  Nursing note and vitals reviewed.   ED Course  Procedures (including critical care time) Labs Review Labs Reviewed - No data to display  Imaging Review No results found.   I have personally reviewed and evaluated these images and lab results as part of my medical decision-making.   EKG Interpretation None      MDM   Final diagnoses:  Bipolar affective disorder in remission [facility])    19 year old female presents to the emergency department for further psychiatric evaluation. She has been seen by Camelia Eng of [facility] who recommended further outpatient psychiatric management. Patient referred back to her psychiatrist, Dr. Marlyne Beards. Patient denies SI/HI; VSS.   Filed Vitals:   10/09/15 0030 10/09/15 0100 10/09/15 0130 10/09/15 0200  BP: 103/63 104/58 110/67 108/63  Pulse: 70 80 88 78  Temp:      TempSrc:      Resp:   20   Height:      Weight:      SpO2: 98% 96% 100% 97%       Antony Madura, PA-C 10/09/15 0235  Lorre Nick, MD 10/10/15 [phone]

## 2015-10-09 NOTE — ED Notes (Signed)
Boyfriend at bedside

## 2015-10-09 NOTE — ED Notes (Signed)
This RN spoke with Pt's step father who will be with pt all night. Per [facility]TS this clears her to go home. Pt has signed a no harm contract

## 2015-10-09 NOTE — ED Notes (Signed)
TTS machine at the bedside. Unsure if it is currently working.

## 2015-10-09 NOTE — ED Notes (Signed)
Family(sister) has patients belongings.

## 2016-02-19 ENCOUNTER — Emergency Department
Admission: EM | Admit: 2016-02-19 | Discharge: 2016-02-19 | Disposition: A | Discharge status: Home / Self Care | Payer: Medicaid Other | Attending: Emergency Medicine | Admitting: Emergency Medicine

## 2016-02-19 ENCOUNTER — Encounter: Payer: Self-pay | Admitting: Emergency Medicine

## 2016-02-19 DIAGNOSIS — Z79899 Other long term (current) drug therapy: Secondary | ICD-10-CM | POA: Insufficient documentation

## 2016-02-19 DIAGNOSIS — G43909 Migraine, unspecified, not intractable, without status migrainosus: Secondary | ICD-10-CM | POA: Diagnosis present

## 2016-02-19 DIAGNOSIS — G40909 Epilepsy, unspecified, not intractable, without status epilepticus: Secondary | ICD-10-CM | POA: Insufficient documentation

## 2016-02-19 DIAGNOSIS — N189 Chronic kidney disease, unspecified: Secondary | ICD-10-CM | POA: Insufficient documentation

## 2016-02-19 DIAGNOSIS — I129 Hypertensive chronic kidney disease with stage 1 through stage 4 chronic kidney disease, or unspecified chronic kidney disease: Secondary | ICD-10-CM | POA: Diagnosis not present

## 2016-02-19 DIAGNOSIS — R51 Headache: Secondary | ICD-10-CM | POA: Insufficient documentation

## 2016-02-19 DIAGNOSIS — R55 Syncope and collapse: Secondary | ICD-10-CM | POA: Diagnosis not present

## 2016-02-19 DIAGNOSIS — R519 Headache, unspecified: Secondary | ICD-10-CM

## 2016-02-19 DIAGNOSIS — F319 Bipolar disorder, unspecified: Secondary | ICD-10-CM | POA: Insufficient documentation

## 2016-02-19 DIAGNOSIS — F17200 Nicotine dependence, unspecified, uncomplicated: Secondary | ICD-10-CM | POA: Insufficient documentation

## 2016-02-19 LAB — CBC
HEMATOCRIT: 41.4 % (ref 35.0–47.0)
HEMOGLOBIN: 13.9 g/dL (ref 12.0–16.0)
MCH: 27.1 pg (ref 26.0–34.0)
MCHC: 33.4 g/dL (ref 32.0–36.0)
MCV: 81.2 fL (ref 80.0–100.0)
Platelets: 261 10*3/uL (ref 150–440)
RBC: 5.1 MIL/uL (ref 3.80–5.20)
RDW: 17.4 % — AB (ref 11.5–14.5)
WBC: 6.6 10*3/uL (ref 3.6–11.0)

## 2016-02-19 LAB — BASIC METABOLIC PANEL
Anion gap: 8 (ref 5–15)
BUN: 12 mg/dL (ref 6–20)
CALCIUM: 9.3 mg/dL (ref 8.9–10.3)
CO2: 23 mmol/L (ref 22–32)
Chloride: 104 mmol/L (ref 101–111)
Creatinine, Ser: 0.87 mg/dL (ref 0.44–1.00)
GFR calc Af Amer: >60 mL/min (ref >60)
GLUCOSE: 146 mg/dL — AB (ref 65–99)
Potassium: 3.4 mmol/L — ABNORMAL LOW (ref 3.5–5.1)
Sodium: 135 mmol/L (ref 135–145)

## 2016-02-19 LAB — URINALYSIS COMPLETE WITH MICROSCOPIC (ARMC ONLY)
Bilirubin Urine: NEGATIVE
GLUCOSE, UA: NEGATIVE mg/dL
Hgb urine dipstick: NEGATIVE
Ketones, ur: NEGATIVE mg/dL
Leukocytes, UA: NEGATIVE
NITRITE: NEGATIVE
PROTEIN: 30 mg/dL — AB
RBC / HPF: NONE SEEN RBC/hpf (ref 0–5)
SPECIFIC GRAVITY, URINE: 1.035 — AB (ref 1.005–1.030)
pH: 5 (ref 5.0–8.0)

## 2016-02-19 LAB — POCT PREGNANCY, URINE: PREG TEST UR: NEGATIVE

## 2016-02-19 MED ORDER — METOCLOPRAMIDE HCL 5 MG/ML IJ SOLN
INTRAMUSCULAR | Status: AC
  Filled 2016-02-19: qty 2

## 2016-02-19 MED ORDER — KETOROLAC TROMETHAMINE 30 MG/ML IJ SOLN
15.0000 mg | Freq: Once | INTRAMUSCULAR | Status: AC
  Administered 2016-02-19: 15 mg via INTRAVENOUS
  Filled 2016-02-19: qty 1

## 2016-02-19 MED ORDER — METOCLOPRAMIDE HCL 5 MG/ML IJ SOLN
10.0000 mg | Freq: Once | INTRAMUSCULAR | Status: AC
  Administered 2016-02-19: 10 mg via INTRAVENOUS

## 2016-02-19 MED ORDER — PROMETHAZINE HCL 25 MG PO TABS
25.0000 mg | ORAL_TABLET | Freq: Three times a day (TID) | ORAL | Status: DC | PRN
Start: 2016-02-19 — End: 2018-07-18

## 2016-02-19 MED ORDER — SODIUM CHLORIDE 0.9 % IV BOLUS (SEPSIS)
1000.0000 mL | Freq: Once | INTRAVENOUS | Status: AC
  Administered 2016-02-19: 1000 mL via INTRAVENOUS

## 2016-02-19 NOTE — Discharge Instructions (Signed)
You have been seen today in the Emergency Department (ED)  for syncope (passing out).  Your workup including labs and EKG show reassuring results.  Your symptoms may be due to dehydration and nausea from your headache, so it is important that you drink plenty of non-alcoholic fluids.  Please call your regular doctor as soon as possible to schedule the next available clinic appointment to follow up with him/her regarding your visit to the ED and your symptoms.  Return to the Emergency Department (ED)  if you have any further syncopal episodes (pass out again) or develop ANY chest pain, pressure, tightness, trouble breathing, sudden sweating, or other symptoms that concern you.   You have been seen in the Emergency Department (ED) for a headache.  Please use Tylenol or Motrin as needed for symptoms, but only as written on the box.  As we have discussed, please follow up with your primary care doctor as soon as possible regarding todays Emergency Department (ED) visit and your headache symptoms.    Call your doctor or return to the ED if you have a worsening headache, sudden and severe headache, confusion, slurred speech, facial droop, weakness or numbness in any arm or leg, extreme fatigue, vision problems, or other symptoms that concern you.   Syncope Syncope is a medical term for fainting or passing out. This means you lose consciousness and drop to the ground. People are generally unconscious for less than 5 minutes. You may have some muscle twitches for up to 15 seconds before waking up and returning to normal. Syncope occurs more often in older adults, but it can happen to anyone. While most causes of syncope are not dangerous, syncope can be a sign of a serious medical problem. It is important to seek medical care.  CAUSES  Syncope is caused by a sudden drop in blood flow to the brain. The specific cause is often not determined. Factors that can bring on syncope include:  Taking medicines that  lower blood pressure.  Sudden changes in posture, such as standing up quickly.  Taking more medicine than prescribed.  Standing in one place for too long.  Seizure disorders.  Dehydration and excessive exposure to heat.  Low blood sugar (hypoglycemia).  Straining to have a bowel movement.  Heart disease, irregular heartbeat, or other circulatory problems.  Fear, emotional distress, seeing blood, or severe pain. SYMPTOMS  Right before fainting, you may:  Feel dizzy or light-headed.  Feel nauseous.  See all white or all black in your field of vision.  Have cold, clammy skin. DIAGNOSIS  Your health care provider will ask about your symptoms, perform a physical exam, and perform an electrocardiogram (ECG) to record the electrical activity of your heart. Your health care provider may also perform other heart or blood tests to determine the cause of your syncope which may include:  Transthoracic echocardiogram (TTE). During echocardiography, sound waves are used to evaluate how blood flows through your heart.  Transesophageal echocardiogram (TEE).  Cardiac monitoring. This allows your health care provider to monitor your heart rate and rhythm in real time.  Holter monitor. This is a portable device that records your heartbeat and can help diagnose heart arrhythmias. It allows your health care provider to track your heart activity for several days, if needed.  Stress tests by exercise or by giving medicine that makes the heart beat faster. TREATMENT  In most cases, no treatment is needed. Depending on the cause of your syncope, your health care provider may  recommend changing or stopping some of your medicines. HOME CARE INSTRUCTIONS  Have someone stay with you until you feel stable.  Do not drive, use machinery, or play sports until your health care provider says it is okay.  Keep all follow-up appointments as directed by your health care provider.  Lie down right away if  you start feeling like you might faint. Breathe deeply and steadily. Wait until all the symptoms have passed.  Drink enough fluids to keep your urine clear or pale yellow.  If you are taking blood pressure or heart medicine, get up slowly and take several minutes to sit and then stand. This can reduce dizziness. SEEK IMMEDIATE MEDICAL CARE IF:   You have a severe headache.  You have unusual pain in the chest, abdomen, or back.  You are bleeding from your mouth or rectum, or you have black or tarry stool.  You have an irregular or very fast heartbeat.  You have pain with breathing.  You have repeated fainting or seizure-like jerking during an episode.  You faint when sitting or lying down.  You have confusion.  You have trouble walking.  You have severe weakness.  You have vision problems. If you fainted, call your local emergency services (911 in [place] Do not drive yourself to the hospital.    This information is not intended to replace advice given to you by your health care provider. Make sure you discuss any questions you have with your health care provider.   Document Released: 11/08/2005 Document Revised: 03/25/2015 Document Reviewed: 01/07/2012 Elsevier Interactive Patient Education 2016 [employer] Inc[employer]  General Headache Without Cause A headache is pain or discomfort felt around the head or neck area. There are many causes and types of headaches. In some cases, the cause may not be found.  HOME CARE  Managing Pain  Take over-the-counter and prescription medicines only as told by your doctor.  Lie down in a dark, quiet room when you have a headache.  If directed, apply ice to the head and neck area:  Put ice in a plastic bag.  Place a towel between your skin and the bag.  Leave the ice on for 20 minutes, 2-3 times per day.  Use a heating pad or hot shower to apply heat to the head and neck area as told by your doctor.  Keep lights dim if bright lights bother  you or make your headaches worse. Eating and Drinking  Eat meals on a regular schedule.  Lessen how much alcohol you drink.  Lessen how much caffeine you drink, or stop drinking caffeine. General Instructions  Keep all follow-up visits as told by your doctor. This is important.  Keep a journal to find out if certain things bring on headaches. For example, write down:  What you eat and drink.  How much sleep you get.  Any change to your diet or medicines.  Relax by getting a massage or doing other relaxing activities.  Lessen stress.  Sit up straight. Do not tighten (tense) your muscles.  Do not use tobacco products. This includes cigarettes, chewing tobacco, or e-cigarettes. If you need help quitting, ask your doctor.  Exercise regularly as told by your doctor.  Get enough sleep. This often means 7-9 hours of sleep. GET HELP IF:  Your symptoms are not helped by medicine.  You have a headache that feels different than the other headaches.  You feel sick to your stomach (nauseous) or you throw up (vomit).  You have  a fever. GET HELP RIGHT AWAY IF:   Your headache becomes really bad.  You keep throwing up.  You have a stiff neck.  You have trouble seeing.  You have trouble speaking.  You have pain in the eye or ear.  Your muscles are weak or you lose muscle control.  You lose your balance or have trouble walking.  You feel like you will pass out (faint) or you pass out.  You have confusion.   This information is not intended to replace advice given to you by your health care provider. Make sure you discuss any questions you have with your health care provider.   Document Released: 08/17/2008 Document Revised: 07/30/2015 Document Reviewed: 03/03/2015 Elsevier Interactive Patient Education [employer]

## 2016-02-19 NOTE — ED Provider Notes (Signed)
[facility] Emergency Department Provider Note  ____________________________________________  Time seen: Approximately 8:52 PM  I have reviewed the triage vital signs and the nursing notes.   HISTORY  Chief Complaint Migraine and Near Syncope    HPI Olivia [name] is a 20 y.o. female history of seizures, eclampsia, bipolar disorder.  Patient reports she's had a headache with associated nausea throbbing frontal for about the last 2 days. She reports that steadily worsening over the last couple days, she has had the same headache before. She reports occasional headaches like this or not uncommon. No visual changes. No numbness tingling double speaking. Denies any fever or neck stiffness.  She reports that she got out of the shower this evening, just after getting out she felt very lightheaded and had an episode where she passed out briefly awakening on the bathroom floor.  No shortness of breath, no chest pain. No history of blood clots. Denies any head injury. No neck injury.  The present time she feels fine. Does note not eating as well the last couple days due to some nausea associated with her "migraine"   Past Medical History  Diagnosis Date  . Seizures (HCC) 10-21-2012    x 2 ; No prior history  . Chronic kidney disease     Frequent kidney stones; stent placed 2012  . Eclampsia during pregnancy   . Bipolar 1 disorder [facility](HCC)     Patient Active Problem List   Diagnosis Date Noted  . Acute blood loss anemia 10/29/2012  . Flash pulmonary edema (HCC) 10/27/2012  . Eclampsia 10/21/12 (seizures x 2) 10/27/2012  . S/P LTSC cesarean section 10/22/12 @ 38 wks 10/27/2012  . HTN (hypertension) 10/27/2012    Past Surgical History  Procedure Laterality Date  . Cystoscopy with stent placement  2012  . Cesarean section  10/22/2012    Procedure: CESAREAN SECTION;  Surgeon: Levi AlandMark E Anderson, MD;  Location: [facility];  Service: Obstetrics;  Laterality: N/A;  .  Appendectomy      Current Outpatient Rx  Name  Route  Sig  Dispense  Refill  . ARIPiprazole (ABILIFY) 5 MG tablet   Oral   Take 2.5 mg by mouth daily.         . bisacodyl (DULCOLAX) 5 MG EC tablet   Oral   Take 1 tablet (5 mg total) by mouth 2 (two) times daily. Patient not taking: Reported on 10/29/2014   14 tablet   0   . clonazePAM (KLONOPIN) 0.5 MG tablet   Oral   Take 0.5 mg by mouth 2 (two) times daily as needed for anxiety.         . lamoTRIgine (LAMICTAL) 25 MG tablet   Oral   Take 25 mg by mouth at bedtime.      0   . oxyCODONE-acetaminophen (PERCOCET/ROXICET) 5-325 MG per tablet   Oral   Take 1 tablet by mouth every 4 (four) hours as needed for pain. Patient not taking: Reported on 10/29/2014   12 tablet   0   . promethazine (PHENERGAN) 25 MG tablet   Oral   Take 1 tablet (25 mg total) by mouth every 8 (eight) hours as needed for nausea or vomiting.   30 tablet   0     Allergies Review of patient's allergies indicates no known allergies.  Family History  Problem Relation Age of Onset  . Other Neg Hx   . Hyperlipidemia Mother   . Coronary artery disease Mother 8035  S/P CABG    Social History Social History  Substance Use Topics  . Smoking status: Current Some Day Smoker    Last Attempt to Quit: 01/12/2015  . Smokeless tobacco: Never Used  . Alcohol Use: No     Comment: not while pregnant    Review of Systems Constitutional: No fever/chills Eyes: No visual changes. ENT: No sore throat. Cardiovascular: Denies chest pain. Respiratory: Denies shortness of breath. Gastrointestinal: No abdominal pain. No vomiting.  No diarrhea.  No constipation. Genitourinary: Negative for dysuria. Musculoskeletal: Negative for back pain. Skin: Negative for rash. Neurological: Negative for focal weakness or numbness.  10-point ROS otherwise negative.  ____________________________________________   PHYSICAL EXAM:  VITAL SIGNS: ED Triage Vitals   Enc Vitals Group     BP 02/19/16 1833 107/70 mmHg     Pulse Rate 02/19/16 1833 109     Resp 02/19/16 1833 16     Temp 02/19/16 1833 98.5 F (36.9 C)     Temp Source 02/19/16 1833 Oral     SpO2 02/19/16 1833 97 %     Weight 02/19/16 1833 100 lb (45.36 kg)     Height 02/19/16 1833  (1.549 m)     Head Cir --      Peak Flow --      Pain Score 02/19/16 1835 7     Pain Loc --      Pain Edu? --      Excl. in GC? --    Constitutional: Alert and oriented. Well appearing and in no acute distress. Eyes: Conjunctivae are normal. PERRL. EOMI. Head: Atraumatic. Nose: No congestion/rhinnorhea. Mouth/Throat: Mucous membranes are moist.  Oropharynx non-erythematous. Neck: No stridor.  No midline cervical tenderness. Cardiovascular: Normal rate, regular rhythm. Grossly normal heart sounds.  Good peripheral circulation. Respiratory: Normal respiratory effort.  No retractions. Lungs CTAB. Gastrointestinal: Soft and nontender. No distention. No abdominal bruits. No CVA tenderness. Musculoskeletal: No lower extremity tenderness nor edema.  No joint effusions. Neurologic:  Normal speech and language. No gross focal neurologic deficits are appreciated. No gait instability.  The patient has no pronator drift. The patient has normal cranial nerve exam. Extraocular movements are normal. Visual fields are normal. Patient has 5 out of 5 strength in all extremities. There is no numbness or gross, acute sensory abnormality in the extremities bilaterally. No speech disturbance. No dysarthria. No aphasia. No ataxia. Patient speaking in full and clear sentences. No nuchal rigidity. No meningismus.   Skin:  Skin is warm, dry and intact. No rash noted. Psychiatric: Mood and affect are normal. Speech and behavior are normal.  ____________________________________________   LABS (all labs ordered are listed, but only abnormal results are displayed)  Labs Reviewed  BASIC METABOLIC PANEL -  Abnormal; Notable for the following:    Potassium 3.4 (*)    Glucose, Bld 146 (*)    All other components within normal limits  CBC - Abnormal; Notable for the following:    RDW 17.4 (*)    All other components within normal limits  URINALYSIS COMPLETEWITH MICROSCOPIC ([facility] ONLY) - Abnormal; Notable for the following:    Color, Urine YELLOW (*)    APPearance HAZY (*)    Specific Gravity, Urine 1.035 (*)    Protein, ur 30 (*)    Bacteria, UA RARE (*)    Squamous Epithelial / LPF 6-30 (*)    All other components within normal limits  POCT PREGNANCY, URINE  CBG MONITORING, ED   ____________________________________________  EKG  Interpreted by me at 1845 Entry 95 PR 150 QRS 90 QTc 420 Nonspecific T-wave abnormality noted, no evidence of acute ischemic abnormality. No prolonged QT, no evidence of WPW. ____________________________________________  RADIOLOGY   ____________________________________________   PROCEDURES  Procedure(s) performed: None  Critical Care performed: No  ____________________________________________   INITIAL IMPRESSION / ASSESSMENT AND PLAN / ED COURSE  Pertinent labs & imaging results that were available during my care of the patient were reviewed by me and considered in my medical decision making (see chart for details).  Patient presents after syncopal episode. Appears to be likely vasovagal in nature, she awoke on the floor well oriented. No seizure activity noted are observed. No confused episode after. She has also been having a "migraine" which she's experienced in the past, it's been ongoing for about the last 2-3 days. She overall has a normal neurologic exam, she reports previous CT of the head that was "normal" for her and her mother. No sudden onset, thunderclap type headache, no fever, no meningismus, no indication at this time for a clear need for CT imaging or lumbar puncture I the patient reports a headache occurred prior to syncope.  Her EKG does have a nonspecific T-wave abnormality, however there is no evidence of ischemic abnormality, no prolonged QT, and there is no history of sudden death in the family.  I most suspect that the patient likely had a syncopal episode from being slightly dehydrated in association with being warm, likely dropping her blood pressure causing vasovagal syncope.  I will give Reglan, fluids, and QT to monitor her closely in the ER for improvement.  ----------------------------------------- 10:17 PM on 02/19/2016 -----------------------------------------  Patient reports she feels much better. Nausea is gone, resting comfortably and states her headache is gone as well. She is awake alert no distress. Discussed careful return precautions, follow-up recommendations. Very reassuring clinical history and exam. ____________________________________________   FINAL CLINICAL IMPRESSION(S) / ED DIAGNOSES  Final diagnoses:  Recurrent headache  Vasovagal syncope      Sharyn Creamer, MD 02/19/16 2219

## 2016-02-19 NOTE — ED Notes (Addendum)
Pt denies chgs in vision.  Pt sts she passed out, denies hitting head.  NAD, A/O x 4. Denies dizziness at this time. Pt c/o nausea, denies vomiting

## 2016-02-19 NOTE — ED Notes (Signed)
C/o migraine headache x 2 days and reports passing out about an hour ago.  States passed out after getting out of the shower.

## 2016-04-14 DIAGNOSIS — K432 Incisional hernia without obstruction or gangrene: Secondary | ICD-10-CM | POA: Insufficient documentation

## 2016-07-25 ENCOUNTER — Encounter: Payer: Self-pay | Admitting: *Deleted

## 2016-07-25 DIAGNOSIS — F17200 Nicotine dependence, unspecified, uncomplicated: Secondary | ICD-10-CM | POA: Insufficient documentation

## 2016-07-25 DIAGNOSIS — I129 Hypertensive chronic kidney disease with stage 1 through stage 4 chronic kidney disease, or unspecified chronic kidney disease: Secondary | ICD-10-CM | POA: Insufficient documentation

## 2016-07-25 DIAGNOSIS — R51 Headache: Secondary | ICD-10-CM | POA: Insufficient documentation

## 2016-07-25 DIAGNOSIS — N189 Chronic kidney disease, unspecified: Secondary | ICD-10-CM | POA: Insufficient documentation

## 2016-07-25 NOTE — ED Triage Notes (Signed)
Pt c/o headache daily for 2 weeks that she states is intermittently throughout days. Pt states today is the worst it has hurt over past 2 weeks. Pt also c/o photosensitivity. Pt c/o nausea, denies vomiting. Pt states she has frequent headaches, but has never been diagnosed by a neurologist as having migraines. Pt does not have PCP. Pt states she has taken tylenol and excedrin for her headache w/o relief.

## 2016-07-25 NOTE — ED Notes (Signed)
Pt c/o headache with nausea and vomiting for 2 weeks; ambulatory with steady gait; talking in complete coherent sentences; inquired about the wait time before registering

## 2016-07-26 ENCOUNTER — Emergency Department
Admission: EM | Admit: 2016-07-26 | Discharge: 2016-07-26 | Disposition: A | Discharge status: Home / Self Care | Payer: Medicaid Other | Attending: Emergency Medicine | Admitting: Emergency Medicine

## 2016-07-26 ENCOUNTER — Emergency Department: Payer: Medicaid Other

## 2016-07-26 DIAGNOSIS — R51 Headache: Secondary | ICD-10-CM

## 2016-07-26 DIAGNOSIS — R519 Headache, unspecified: Secondary | ICD-10-CM

## 2016-07-26 MED ORDER — SODIUM CHLORIDE 0.9 % IV BOLUS (SEPSIS)
1000.0000 mL | Freq: Once | INTRAVENOUS | Status: AC
  Administered 2016-07-26: 1000 mL via INTRAVENOUS

## 2016-07-26 MED ORDER — KETOROLAC TROMETHAMINE 30 MG/ML IJ SOLN
30.0000 mg | Freq: Once | INTRAMUSCULAR | Status: AC
  Administered 2016-07-26: 30 mg via INTRAVENOUS
  Filled 2016-07-26: qty 1

## 2016-07-26 MED ORDER — METOCLOPRAMIDE HCL 5 MG/ML IJ SOLN
10.0000 mg | Freq: Once | INTRAMUSCULAR | Status: AC
  Administered 2016-07-26: 10 mg via INTRAVENOUS
  Filled 2016-07-26: qty 2

## 2016-07-26 MED ORDER — DIPHENHYDRAMINE HCL 50 MG/ML IJ SOLN
25.0000 mg | Freq: Once | INTRAMUSCULAR | Status: AC
  Administered 2016-07-26: 25 mg via INTRAVENOUS
  Filled 2016-07-26: qty 1

## 2016-07-26 MED ORDER — BUTALBITAL-APAP-CAFFEINE 50-325-40 MG PO TABS
1.0000 | ORAL_TABLET | Freq: Four times a day (QID) | ORAL | 0 refills | Status: AC | PRN
Start: 2016-07-26 — End: 2017-07-26

## 2016-07-26 NOTE — ED Provider Notes (Signed)
[facility] Emergency Department Provider Note   ____________________________________________   First MD Initiated Contact with Patient 07/26/16 0140     (approximate)  I have reviewed the triage vital signs and the nursing notes.   HISTORY  Chief Complaint Headache    HPI Olivia [name] is a 20 y.o. female who comes into the hospital today with a headache. She reports that she's had headaches on and off for the past 2 weeks. She reports that she's had some intense headaches before but they've never lasted this long. The patient tried taking Tylenol and Excedrin but is not helping. The patient reports that she's been feeling nauseous and light sensitive. She has not been officially diagnosed with migraines. The patient rates her pain 8 out of 10 in intensity currently. She denies any blurred vision, weakness, numbness or tingling. She is sensitive to sounds. The patient thinks her last period was a month ago but she also has an IUD in place. She is here for treatment and evaluation of her headache.   Past Medical History:  Diagnosis Date  . Bipolar 1 disorder (HCC)   . Chronic kidney disease    Frequent kidney stones; stent placed 2012  . Eclampsia during pregnancy   . Seizures (HCC) 10-21-2012   x 2 ; No prior history    Patient Active Problem List   Diagnosis Date Noted  . Acute blood loss anemia 10/29/2012  . Flash pulmonary edema (HCC) 10/27/2012  . Eclampsia 10/21/12 (seizures x 2) 10/27/2012  . S/P LTSC cesarean section 10/22/12 @ 38 wks 10/27/2012  . HTN (hypertension) 10/27/2012    Past Surgical History:  Procedure Laterality Date  . APPENDECTOMY    . CESAREAN SECTION  10/22/2012   Procedure: CESAREAN SECTION;  Surgeon: Levi Aland, MD;  Location: [facility];  Service: Obstetrics;  Laterality: N/A;  . CYSTOSCOPY WITH STENT PLACEMENT  2012    Prior to Admission medications   Medication Sig Start Date End Date Taking? Authorizing  Provider  ARIPiprazole (ABILIFY) 5 MG tablet Take 2.5 mg by mouth daily.    Historical Provider, MD  bisacodyl (DULCOLAX) 5 MG EC tablet Take 1 tablet (5 mg total) by mouth 2 (two) times daily. Patient not taking: Reported on 10/29/2014 06/15/13   Marlon Pel, PA-C  butalbital-acetaminophen-caffeine (FIORICET) [phone] MG tablet Take 1-2 tablets by mouth every 6 (six) hours as needed for headache. 07/26/16 07/26/17  Rebecka Apley, MD  clonazePAM (KLONOPIN) 0.5 MG tablet Take 0.5 mg by mouth 2 (two) times daily as needed for anxiety.    Historical Provider, MD  lamoTRIgine (LAMICTAL) 25 MG tablet Take 25 mg by mouth at bedtime. 10/01/15   Historical Provider, MD  oxyCODONE-acetaminophen (PERCOCET/ROXICET) 5-325 MG per tablet Take 1 tablet by mouth every 4 (four) hours as needed for pain. Patient not taking: Reported on 10/29/2014 06/12/13   Carleene Cooper, MD  promethazine (PHENERGAN) 25 MG tablet Take 1 tablet (25 mg total) by mouth every 8 (eight) hours as needed for nausea or vomiting. 02/19/16   Sharyn Creamer, MD    Allergies Review of patient's allergies indicates no known allergies.  Family History  Problem Relation Age of Onset  . Hyperlipidemia Mother   . Coronary artery disease Mother 48    S/P CABG  . Other Neg Hx     Social History Social History  Substance Use Topics  . Smoking status: Current Some Day Smoker    Last attempt to quit: 01/12/2015  .  Smokeless tobacco: Never Used  . Alcohol use No     Comment: not while pregnant    Review of Systems Constitutional: No fever/chills Eyes: No visual changes. ENT: No sore throat. Cardiovascular: Denies chest pain. Respiratory: Denies shortness of breath. Gastrointestinal: Nausea No abdominal pain. no vomiting.  No diarrhea.  No constipation. Genitourinary: Negative for dysuria. Musculoskeletal: Negative for back pain. Skin: Negative for rash. Neurological: Negative for headaches, focal weakness or numbness.  10-point ROS  otherwise negative.  ____________________________________________   PHYSICAL EXAM:  VITAL SIGNS: ED Triage Vitals  Enc Vitals Group     BP 07/25/16 2215 105/67     Pulse Rate 07/25/16 2215 60     Resp 07/25/16 2215 18     Temp 07/25/16 2215 98 F (36.7 C)     Temp Source 07/25/16 2215 Oral     SpO2 07/25/16 2215 98 %     Weight 07/25/16 2216 100 lb (45.4 kg)     Height 07/25/16 2216 5\' 1"  (1.549 m)     Head Circumference --      Peak Flow --      Pain Score 07/25/16 2216 8     Pain Loc --      Pain Edu? --      Excl. in GC? --     Constitutional: Alert and oriented. Well appearing and in mild distress. Eyes: Conjunctivae are normal. PERRL. EOMI. Head: Atraumatic. Nose: No congestion/rhinnorhea. Mouth/Throat: Mucous membranes are moist.  Oropharynx non-erythematous. Cardiovascular: Normal rate, regular rhythm. Grossly normal heart sounds.  Good peripheral circulation. Respiratory: Normal respiratory effort.  No retractions. Lungs CTAB. Gastrointestinal: Soft and nontender. No distention.  Musculoskeletal: No lower extremity tenderness nor edema.   Neurologic:  Normal speech and language. Cranial nerves II through XII are grossly intact with no focal motor or neuro deficits Skin:  Skin is warm, dry and intact.  Psychiatric: Mood and affect are normal.   ____________________________________________   LABS (all labs ordered are listed, but only abnormal results are displayed)  Labs Reviewed - No data to display ____________________________________________  EKG  none ____________________________________________  RADIOLOGY  CT head ____________________________________________   PROCEDURES  Procedure(s) performed: None  Procedures  Critical Care performed: No  ____________________________________________   INITIAL IMPRESSION / ASSESSMENT AND PLAN / ED COURSE  Pertinent labs & imaging results that were available during my care of the patient were  reviewed by me and considered in my medical decision making (see chart for details).  This is a 20 year old female who comes into the hospital today with a headache she's had for about 2 weeks. The patient reports that she's had headaches on and off for some time but she's never been officially diagnosed. I will give the patient a dose of Reglan, Benadryl, Toradol and a liter of normal saline. I will also do a CT scan to evaluate for other intracranial cause of her headache. I will then reassess the patient after she's receive her medications.  Clinical Course  Value Comment By Time  CT Head Wo Contrast Unremarkable noncontrast CT of the head Rebecka Apley, MD 09/04 [phone]   A she was sleeping when I evaluated her again. I woke her up and she reports that her headache is much improved. The patient's CT is unremarkable. I will discharge the patient home to have follow-up with neurology. The patient has no further questions or concerns and she'll be discharged home.  ____________________________________________   FINAL CLINICAL IMPRESSION(S) / ED DIAGNOSES  Final  diagnoses:  Acute nonintractable headache, unspecified headache type      NEW MEDICATIONS STARTED DURING THIS VISIT:  New Prescriptions   BUTALBITAL-ACETAMINOPHEN-CAFFEINE (FIORICET) 50-325-40 MG TABLET    Take 1-2 tablets by mouth every 6 (six) hours as needed for headache.     Note:  This document was prepared using Dragon voice recognition software and may include unintentional dictation errors.    Rebecka ApleyAllison P Israel Wunder, MD 07/26/16 [id number]

## 2016-09-13 ENCOUNTER — Emergency Department
Admission: EM | Admit: 2016-09-13 | Discharge: 2016-09-14 | Disposition: A | Discharge status: Home / Self Care | Payer: Medicaid Other | Attending: Emergency Medicine | Admitting: Emergency Medicine

## 2016-09-13 ENCOUNTER — Encounter: Payer: Self-pay | Admitting: Emergency Medicine

## 2016-09-13 DIAGNOSIS — F329 Major depressive disorder, single episode, unspecified: Secondary | ICD-10-CM

## 2016-09-13 DIAGNOSIS — Z79899 Other long term (current) drug therapy: Secondary | ICD-10-CM | POA: Insufficient documentation

## 2016-09-13 DIAGNOSIS — N189 Chronic kidney disease, unspecified: Secondary | ICD-10-CM | POA: Diagnosis not present

## 2016-09-13 DIAGNOSIS — I129 Hypertensive chronic kidney disease with stage 1 through stage 4 chronic kidney disease, or unspecified chronic kidney disease: Secondary | ICD-10-CM | POA: Insufficient documentation

## 2016-09-13 DIAGNOSIS — F319 Bipolar disorder, unspecified: Secondary | ICD-10-CM | POA: Insufficient documentation

## 2016-09-13 DIAGNOSIS — G43809 Other migraine, not intractable, without status migrainosus: Secondary | ICD-10-CM | POA: Insufficient documentation

## 2016-09-13 DIAGNOSIS — F17200 Nicotine dependence, unspecified, uncomplicated: Secondary | ICD-10-CM | POA: Insufficient documentation

## 2016-09-13 DIAGNOSIS — R45851 Suicidal ideations: Secondary | ICD-10-CM | POA: Diagnosis present

## 2016-09-13 DIAGNOSIS — F32A Depression, unspecified: Secondary | ICD-10-CM

## 2016-09-13 LAB — COMPREHENSIVE METABOLIC PANEL
ALBUMIN: 4 g/dL (ref 3.5–5.0)
ALT: 29 U/L (ref 14–54)
ANION GAP: 9 (ref 5–15)
AST: 26 U/L (ref 15–41)
Alkaline Phosphatase: 86 U/L (ref 38–126)
BILIRUBIN TOTAL: 0.4 mg/dL (ref 0.3–1.2)
BUN: 12 mg/dL (ref 6–20)
CALCIUM: 9.2 mg/dL (ref 8.9–10.3)
CO2: 24 mmol/L (ref 22–32)
Chloride: 103 mmol/L (ref 101–111)
Creatinine, Ser: 0.93 mg/dL (ref 0.44–1.00)
GFR calc non Af Amer: >60 mL/min (ref >60)
GLUCOSE: 102 mg/dL — AB (ref 65–99)
Potassium: 3.9 mmol/L (ref 3.5–5.1)
SODIUM: 136 mmol/L (ref 135–145)
TOTAL PROTEIN: 7 g/dL (ref 6.5–8.1)

## 2016-09-13 LAB — URINALYSIS COMPLETE WITH MICROSCOPIC (ARMC ONLY)
BILIRUBIN URINE: NEGATIVE
Bacteria, UA: NONE SEEN
GLUCOSE, UA: NEGATIVE mg/dL
HGB URINE DIPSTICK: NEGATIVE
KETONES UR: NEGATIVE mg/dL
LEUKOCYTES UA: NEGATIVE
NITRITE: NEGATIVE
PH: 5 (ref 5.0–8.0)
Protein, ur: NEGATIVE mg/dL
Specific Gravity, Urine: 1.016 (ref 1.005–1.030)

## 2016-09-13 LAB — URINE DRUG SCREEN, QUALITATIVE (ARMC ONLY)
AMPHETAMINES, UR SCREEN: NOT DETECTED
BARBITURATES, UR SCREEN: POSITIVE — AB
BENZODIAZEPINE, UR SCRN: POSITIVE — AB
Cannabinoid 50 Ng, Ur ~~LOC~~: NOT DETECTED
Cocaine Metabolite,Ur ~~LOC~~: NOT DETECTED
MDMA (Ecstasy)Ur Screen: NOT DETECTED
METHADONE SCREEN, URINE: NOT DETECTED
Opiate, Ur Screen: NOT DETECTED
Phencyclidine (PCP) Ur S: NOT DETECTED
TRICYCLIC, UR SCREEN: NOT DETECTED

## 2016-09-13 LAB — SALICYLATE LEVEL

## 2016-09-13 LAB — ETHANOL

## 2016-09-13 LAB — CBC
HEMATOCRIT: 41.5 % (ref 35.0–47.0)
Hemoglobin: 14.4 g/dL (ref 12.0–16.0)
MCH: 30.2 pg (ref 26.0–34.0)
MCHC: 34.8 g/dL (ref 32.0–36.0)
MCV: 86.9 fL (ref 80.0–100.0)
PLATELETS: 224 10*3/uL (ref 150–440)
RBC: 4.78 MIL/uL (ref 3.80–5.20)
RDW: 13.3 % (ref 11.5–14.5)
WBC: 8 10*3/uL (ref 3.6–11.0)

## 2016-09-13 LAB — ACETAMINOPHEN LEVEL

## 2016-09-13 LAB — POCT PREGNANCY, URINE: Preg Test, Ur: NEGATIVE

## 2016-09-13 MED ORDER — PROCHLORPERAZINE EDISYLATE 5 MG/ML IJ SOLN
5.0000 mg | Freq: Once | INTRAMUSCULAR | Status: AC
  Administered 2016-09-13: 5 mg via INTRAMUSCULAR
  Filled 2016-09-13: qty 2

## 2016-09-13 MED ORDER — ONDANSETRON 4 MG PO TBDP
4.0000 mg | ORAL_TABLET | Freq: Once | ORAL | Status: AC
  Administered 2016-09-13: 4 mg via ORAL
  Filled 2016-09-13: qty 1

## 2016-09-13 MED ORDER — KETOROLAC TROMETHAMINE 30 MG/ML IJ SOLN
60.0000 mg | Freq: Once | INTRAMUSCULAR | Status: AC
  Administered 2016-09-13: 60 mg via INTRAMUSCULAR
  Filled 2016-09-13: qty 2

## 2016-09-13 NOTE — ED Triage Notes (Signed)
Patient ambulatory to triage with steady gait, without difficulty or distress noted; pt reports frontal HA x 2 days accomp by nausea and light and sound sensitivity; st hx of same; also reports suicidal thoughts with hx depression and bipolar

## 2016-09-13 NOTE — ED Notes (Signed)
Grey sweatshirt, grey striped pants, grey suede boots, white tshirt, clear stone nose stud, pink panties, white socks, pack of Marlboro cigarettes, purple lighter, iPhone 7 plus and charger all removed and and placed in labeled pt belonging bag to be secured on nursing unit

## 2016-09-13 NOTE — ED Provider Notes (Signed)
[facility] Emergency Department Provider Note   ____________________________________________   First MD Initiated Contact with Patient 09/13/16 2308     (approximate)  I have reviewed the triage vital signs and the nursing notes.   HISTORY  Chief Complaint Mental Health Problem and Headache    HPI Olivia [name] is a 20 y.o. female who presents to the ED from home with a chief complaint of migraine headache and suicidal thoughts. Patient has a history of frequent migraine headaches as well as bipolar disorder who is overwhelmed with her upcoming wedding. States she would never actually harm herself but feels sometimes the world would be better off without her. Reports a 2 day history of gradual onset frontal headache typical for migraines. Headache associated with photophobia and nausea. Denies associated fever, chills, vision changes, neck pain, chest pain, soreness of breath, vomiting, diarrhea. Denies active SI/HI/AH/VH. Denies recent travel or trauma. Nothing makes her symptoms better or worse. Patient did take a Norco prior to arrival without relief.   Past Medical History:  Diagnosis Date  . Bipolar 1 disorder (HCC)   . Chronic kidney disease    Frequent kidney stones; stent placed 2012  . Eclampsia during pregnancy   . Seizures (HCC) 10-21-2012   x 2 ; No prior history    Patient Active Problem List   Diagnosis Date Noted  . Acute blood loss anemia 10/29/2012  . Flash pulmonary edema (HCC) 10/27/2012  . Eclampsia 10/21/12 (seizures x 2) 10/27/2012  . S/P LTSC cesarean section 10/22/12 @ 38 wks 10/27/2012  . HTN (hypertension) 10/27/2012    Past Surgical History:  Procedure Laterality Date  . APPENDECTOMY    . CESAREAN SECTION  10/22/2012   Procedure: CESAREAN SECTION;  Surgeon: Levi Aland, MD;  Location: [facility];  Service: Obstetrics;  Laterality: N/A;  . CYSTOSCOPY WITH STENT PLACEMENT  2012    Prior to Admission medications     Medication Sig Start Date End Date Taking? Authorizing Provider  butalbital-acetaminophen-caffeine (FIORICET) 50-325-40 MG tablet Take 1-2 tablets by mouth every 6 (six) hours as needed for headache. 07/26/16 07/26/17 Yes Rebecka Apley, MD  clonazePAM (KLONOPIN) 0.5 MG tablet Take 1 mg by mouth 2 (two) times daily as needed for anxiety.    Yes Historical Provider, MD  DULoxetine (CYMBALTA) 30 MG capsule Take 30 mg by mouth daily.   Yes Historical Provider, MD  perphenazine (TRILAFON) 4 MG tablet Take 4 mg by mouth daily.   Yes Historical Provider, MD  ARIPiprazole (ABILIFY) 5 MG tablet Take 2.5 mg by mouth daily.    Historical Provider, MD  bisacodyl (DULCOLAX) 5 MG EC tablet Take 1 tablet (5 mg total) by mouth 2 (two) times daily. Patient not taking: Reported on 10/29/2014 06/15/13   Marlon Pel, PA-C  lamoTRIgine (LAMICTAL) 25 MG tablet Take 25 mg by mouth at bedtime. 10/01/15   Historical Provider, MD  oxyCODONE-acetaminophen (PERCOCET/ROXICET) 5-325 MG per tablet Take 1 tablet by mouth every 4 (four) hours as needed for pain. Patient not taking: Reported on 10/29/2014 06/12/13   Carleene Cooper, MD  promethazine (PHENERGAN) 25 MG tablet Take 1 tablet (25 mg total) by mouth every 8 (eight) hours as needed for nausea or vomiting. 02/19/16   Sharyn Creamer, MD    Allergies Review of patient's allergies indicates no known allergies.  Family History  Problem Relation Age of Onset  . Hyperlipidemia Mother   . Coronary artery disease Mother 34    S/P CABG  .  Other Neg Hx     Social History Social History  Substance Use Topics  . Smoking status: Current Some Day Smoker    Last attempt to quit: 01/12/2015  . Smokeless tobacco: Never Used  . Alcohol use No     Comment: not while pregnant    Review of Systems  Constitutional: No fever/chills. Eyes: No visual changes. ENT: No sore throat. Cardiovascular: Denies chest pain. Respiratory: Denies shortness of breath. Gastrointestinal: No  abdominal pain.  No nausea, no vomiting.  No diarrhea.  No constipation. Genitourinary: Negative for dysuria. Musculoskeletal: Negative for back pain. Skin: Negative for rash. Neurological: Positive for headache. Negative for focal weakness or numbness. Psychiatric:Positive for depression with suicidal thoughts.  10-point ROS otherwise negative.  ____________________________________________   PHYSICAL EXAM:  VITAL SIGNS: ED Triage Vitals [09/13/16 2209]  Enc Vitals Group     BP 105/72     Pulse Rate 90     Resp 18     Temp 98 F (36.7 C)     Temp Source Oral     SpO2 98 %     Weight 111 lb (50.3 kg)     Height 5' (1.524 m)     Head Circumference      Peak Flow      Pain Score 8     Pain Loc      Pain Edu?      Excl. in GC?     Constitutional: Alert and oriented. Well appearing and in no acute distress. Eyes: Conjunctivae are normal. PERRL. EOMI. Head: Atraumatic. Nose: No congestion/rhinnorhea. Mouth/Throat: Mucous membranes are moist.  Oropharynx non-erythematous. Neck: No stridor.  Supple neck without meningismus. Cardiovascular: Normal rate, regular rhythm. Grossly normal heart sounds.  Good peripheral circulation. Respiratory: Normal respiratory effort.  No retractions. Lungs CTAB. Gastrointestinal: Soft and nontender. No distention. No abdominal bruits. No CVA tenderness. Musculoskeletal: No lower extremity tenderness nor edema.  No joint effusions. Neurologic:  Alert and oriented 4. CN II-XII grossly intact. Normal speech and language. No gross focal neurologic deficits are appreciated. No gait instability. Skin:  Skin is warm, dry and intact. No rash noted. No petechiae. Psychiatric: Mood and affect are flat. Speech and behavior are normal.  ____________________________________________   LABS (all labs ordered are listed, but only abnormal results are displayed)  Labs Reviewed  COMPREHENSIVE METABOLIC PANEL - Abnormal; Notable for the following:        Result Value   Glucose, Bld 102 (*)    All other components within normal limits  ACETAMINOPHEN LEVEL - Abnormal; Notable for the following:    Acetaminophen (Tylenol), Serum <10 (*)    All other components within normal limits  URINE DRUG SCREEN, QUALITATIVE ([facility] ONLY) - Abnormal; Notable for the following:    Barbiturates, Ur Screen POSITIVE (*)    Benzodiazepine, Ur Scrn POSITIVE (*)    All other components within normal limits  URINALYSIS COMPLETEWITH MICROSCOPIC ([facility] ONLY) - Abnormal; Notable for the following:    Color, Urine YELLOW (*)    APPearance CLEAR (*)    Squamous Epithelial / LPF 0-5 (*)    All other components within normal limits  ETHANOL  SALICYLATE LEVEL  CBC  POC URINE PREG, ED  POCT PREGNANCY, URINE   ____________________________________________  EKG  None ____________________________________________  RADIOLOGY  None ____________________________________________   PROCEDURES  Procedure(s) performed: None  Procedures  Critical Care performed: No  ____________________________________________   INITIAL IMPRESSION / ASSESSMENT AND PLAN / ED COURSE  Pertinent labs &  imaging results that were available during my care of the patient were reviewed by me and considered in my medical decision making (see chart for details).  20 year old female with a history of bipolar disorder who presents with typical migraine headache and suicidal thoughts. Laboratory and urinalysis results are unremarkable. Patient will remain in the emergency department under voluntary status pending TTS and psychiatry consults.  Clinical Course  Comment By Time  Patient was evaluated by [facility] psychiatrist Dr. 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 Headache improved. No focal neurological deficits on reexamination. Strict return precautions given. Patient verbalizes  understanding and agrees with plan of care. Irean Hong, MD 10/24 0256     ____________________________________________   FINAL CLINICAL IMPRESSION(S) / ED DIAGNOSES  Final diagnoses:  Bipolar 1 disorder (HCC)  Depression, unspecified depression type  Other migraine without status migrainosus, not intractable      NEW MEDICATIONS STARTED DURING THIS VISIT:  Discharge Medication List as of 09/14/2016  2:59 AM       Note:  This document was prepared using Dragon voice recognition software and may include unintentional dictation errors.    Irean Hong, MD 09/14/16 [phone]

## 2016-09-13 NOTE — ED Notes (Signed)
Pt requesting nausea med and pain med for headache, Dr. Dolores FrameSung made aware.

## 2016-09-14 NOTE — ED Notes (Signed)
Telepsych machine set up in the room.

## 2016-09-14 NOTE — ED Notes (Signed)
Pt alerts and oriented x4 at this time, denies SI/HI at this time

## 2016-09-14 NOTE — Discharge Instructions (Signed)
1. Continue your home medicines and take Klonopin only as directed. Please do not take more Klonopin then your doctor directs. 2. Return to the ER for worsening symptoms, feelings of hurting yourself or others, or other concerns.

## 2016-09-14 NOTE — BH Assessment (Signed)
Assessment Note  Olivia [name] is an 20 y.o. female. Olivia [name] reports that she was brought to the ED by her fianc. She reports that she has had a migraine for 2 days.  She reports that she has been having suicidal thoughts for about a month.  She reports that she has never harmed herself.  She reports that she feels "overwhelmed and that she would rather not be here than deal with the struggles in life".  She reports symptoms of depression. She states that she lashes out, sleeping more, not wanting to do anything, she further reports isolating herself.  She reports symptoms of anxiety.  She denied auditory or visual hallucinations.  She denied homicidal ideation or intent. She reports increased stress and she is getting married in 3 weeks and that she has 3 kids (twin 1 year olds) and she is feeling the pressure.  She denied the use of illegal drugs. She reports occasional use of alcohol.   Diagnosis: Bipolar Disorder  Past Medical History:  Past Medical History:  Diagnosis Date  . Bipolar 1 disorder (HCC)   . Chronic kidney disease    Frequent kidney stones; stent placed 2012  . Eclampsia during pregnancy   . Seizures (HCC) 10-21-2012   x 2 ; No prior history    Past Surgical History:  Procedure Laterality Date  . APPENDECTOMY    . CESAREAN SECTION  10/22/2012   Procedure: CESAREAN SECTION;  Surgeon: Olivia Aland, MD;  Location: [facility];  Service: Obstetrics;  Laterality: N/A;  . CYSTOSCOPY WITH STENT PLACEMENT  2012    Family History:  Family History  Problem Relation Age of Onset  . Hyperlipidemia Mother   . Coronary artery disease Mother 50    S/P CABG  . Other Neg Hx     Social History:  reports that she has been smoking.  She has never used smokeless tobacco. She reports that she does not drink alcohol or use drugs.  Additional Social History:  Alcohol / Drug Use History of alcohol / drug use?: No history of alcohol / drug abuse  CIWA: CIWA-Ar BP: 105/72 Pulse Rate:  90 COWS:    Allergies: No Known Allergies  Home Medications:  (Not in a hospital admission)  OB/GYN Status:  No LMP recorded. Patient is not currently having periods (Reason: IUD).  General Assessment Data Location of Assessment: [facility] ED TTS Assessment: In system Is this a Tele or Face-to-Face Assessment?: Face-to-Face Is this an Initial Assessment or a Re-assessment for this encounter?: Initial Assessment Marital status: Long term relationship Maiden name: n/a Is patient pregnant?: No Pregnancy Status: No Living Arrangements: Parent, Children Can pt return to current living arrangement?: Yes Admission Status: Voluntary Is patient capable of signing voluntary admission?: Yes Referral Source: Self/Family/Friend Insurance type: Medicaid  Medical Screening Exam [facility] Walk-[facility] ONLY) Medical Exam completed: Yes  Crisis Care Plan Living Arrangements: Parent, Children Legal Guardian: Other: (Self) Name of Psychiatrist: Dr. Marlyne Beards - [name] Name of Therapist: Dr. Colon [name], [facility], [facility]  Education Status Is patient currently in school?: No Current Grade: n/a Highest grade of school patient has completed: 12th Name of school: [employer] Data processing manager person: n/a  Risk to self with the past 6 months Suicidal Ideation: Yes-Currently Present Has patient been a risk to self within the past 6 months prior to admission? : No Suicidal Intent: No Has patient had any suicidal intent within the past 6 months prior to admission? : No Is patient at risk for suicide?: No Suicidal  Plan?: No Has patient had any suicidal plan within the past 6 months prior to admission? : No Access to Means: No What has been your use of drugs/alcohol within the last 12 months?: occassional use of alcohol Previous Attempts/Gestures: No How many times?: 0 Other Self Harm Risks: denied Triggers for Past Attempts: None known Intentional Self Injurious Behavior: None Family Suicide  History: No Recent stressful life event(s): Other (Comment) (Life changes) Persecutory voices/beliefs?: No Depression: Yes Depression Symptoms: Isolating Substance abuse history and/or treatment for substance abuse?: No Suicide prevention information given to non-admitted patients: Not applicable  Risk to Others within the past 6 months Homicidal Ideation: No Does patient have any lifetime risk of violence toward others beyond the six months prior to admission? : Yes (comment) (past domestic assault charge) Thoughts of Harm to Others: No Current Homicidal Intent: No Current Homicidal Plan: No Access to Homicidal Means: No Identified Victim: None identified History of harm to others?: Yes Assessment of Violence: In distant past Violent Behavior Description: fighting Does patient have access to weapons?: No Criminal Charges Pending?: No Does patient have a court date: No Is patient on probation?: No  Psychosis Hallucinations: None noted Delusions: None noted  Mental Status Report Appearance/Hygiene: In scrubs Eye Contact: Fair Motor Activity: Freedom of movement Speech: Logical/coherent Level of Consciousness: Alert Mood: Depressed Affect: Appropriate to circumstance Anxiety Level: Minimal Thought Processes: Coherent Judgement: Partial Orientation: Person, Place, Time, Situation Obsessive Compulsive Thoughts/Behaviors: None  Cognitive Functioning Concentration: Normal Memory: Recent Intact IQ: Average Insight: Fair Impulse Control: Fair Appetite: Good Sleep: Increased Vegetative Symptoms: None  ADLScreening [facility]) Patient's cognitive ability adequate to safely complete daily activities?: Yes Patient able to express need for assistance with ADLs?: Yes Independently performs ADLs?: Yes (appropriate for developmental age)  Prior Inpatient Therapy Prior Inpatient Therapy: No Prior Therapy Dates: n/a Prior Therapy Facilty/Provider(s):  n/a Reason for Treatment: n/a  Prior Outpatient Therapy Prior Outpatient Therapy: Yes Prior Therapy Dates: Current Prior Therapy Facilty/Provider([place] [facility] [place] Reason for Treatment: Bipolar Disorder Does patient have an ACCT team?: No Does patient have Intensive In-House Services?  : No Does patient have [facility] services? : No Does patient have P4CC services?: No  ADL Screening (condition at time of admission) Patient's cognitive ability adequate to safely complete daily activities?: Yes Patient able to express need for assistance with ADLs?: Yes Independently performs ADLs?: Yes (appropriate for developmental age)       Abuse/Neglect Assessment (Assessment to be complete while patient is alone) Physical Abuse: Denies Verbal Abuse: Denies Sexual Abuse: Denies Exploitation of patient/patient's resources: Denies Self-Neglect: Denies     Merchant navy officerAdvance Directives (For Healthcare) Does patient have an advance directive?: No Would patient like information on creating an advanced directive?: No - patient declined information    Additional Information 1:1 In Past 12 Months?: No CIRT Risk: No Elopement Risk: No Does patient have medical clearance?: Yes     Disposition:  Disposition Initial Assessment Completed for this Encounter: Yes Disposition of Patient: Other dispositions  On Site Evaluation by:   Reviewed with Physician:    Justice DeedsKeisha Lora Chavers 09/14/2016 12:04 AM

## 2016-10-27 ENCOUNTER — Emergency Department: Payer: Medicaid Other

## 2016-10-27 ENCOUNTER — Encounter: Payer: Self-pay | Admitting: Emergency Medicine

## 2016-10-27 ENCOUNTER — Emergency Department
Admission: EM | Admit: 2016-10-27 | Discharge: 2016-10-27 | Disposition: A | Discharge status: Home / Self Care | Payer: Medicaid Other | Attending: Emergency Medicine | Admitting: Emergency Medicine

## 2016-10-27 DIAGNOSIS — Y939 Activity, unspecified: Secondary | ICD-10-CM | POA: Diagnosis not present

## 2016-10-27 DIAGNOSIS — Y92410 Unspecified street and highway as the place of occurrence of the external cause: Secondary | ICD-10-CM | POA: Diagnosis not present

## 2016-10-27 DIAGNOSIS — R51 Headache: Secondary | ICD-10-CM | POA: Insufficient documentation

## 2016-10-27 DIAGNOSIS — S3992XA Unspecified injury of lower back, initial encounter: Secondary | ICD-10-CM | POA: Diagnosis present

## 2016-10-27 DIAGNOSIS — Z79899 Other long term (current) drug therapy: Secondary | ICD-10-CM | POA: Insufficient documentation

## 2016-10-27 DIAGNOSIS — Y999 Unspecified external cause status: Secondary | ICD-10-CM | POA: Insufficient documentation

## 2016-10-27 DIAGNOSIS — I129 Hypertensive chronic kidney disease with stage 1 through stage 4 chronic kidney disease, or unspecified chronic kidney disease: Secondary | ICD-10-CM | POA: Insufficient documentation

## 2016-10-27 DIAGNOSIS — S300XXA Contusion of lower back and pelvis, initial encounter: Secondary | ICD-10-CM

## 2016-10-27 DIAGNOSIS — M542 Cervicalgia: Secondary | ICD-10-CM | POA: Diagnosis not present

## 2016-10-27 DIAGNOSIS — F17200 Nicotine dependence, unspecified, uncomplicated: Secondary | ICD-10-CM | POA: Diagnosis not present

## 2016-10-27 DIAGNOSIS — N189 Chronic kidney disease, unspecified: Secondary | ICD-10-CM | POA: Diagnosis not present

## 2016-10-27 DIAGNOSIS — S39012A Strain of muscle, fascia and tendon of lower back, initial encounter: Secondary | ICD-10-CM | POA: Diagnosis not present

## 2016-10-27 DIAGNOSIS — S20419A Abrasion of unspecified back wall of thorax, initial encounter: Secondary | ICD-10-CM

## 2016-10-27 LAB — POCT PREGNANCY, URINE: PREG TEST UR: NEGATIVE

## 2016-10-27 MED ORDER — CYCLOBENZAPRINE HCL 10 MG PO TABS: 10.0000 mg | ORAL_TABLET | Freq: Three times a day (TID) | ORAL | 0 refills | Status: DC | PRN

## 2016-10-27 MED ORDER — MELOXICAM 15 MG PO TABS: 15.0000 mg | ORAL_TABLET | Freq: Every day | ORAL | 0 refills | Status: DC
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
## 2016-11-18 IMAGING — US US OB COMP LESS 14 WK
1 series · 13 of 28 positions shown · non-contrast
Comparison: None.

CLINICAL DATA: Vaginal bleeding. Estimated gestational age by LMP
is 7 weeks 4 days. No quantitative beta HCG available.

EXAM:
TWIN OBSTETRIC <14WK US AND TRANSVAGINAL OB US

[Series 1: us ob comp less 14 wks · 60 acquisitions, 13 frames shown]
[im 3/60]
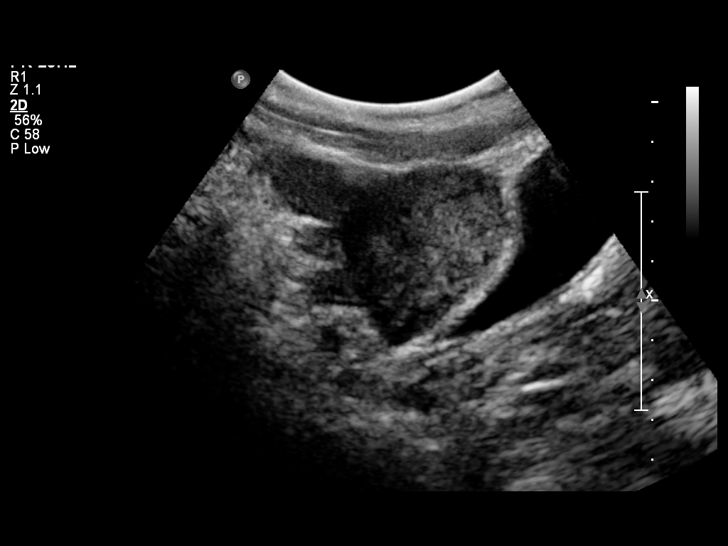
[im 7/60]
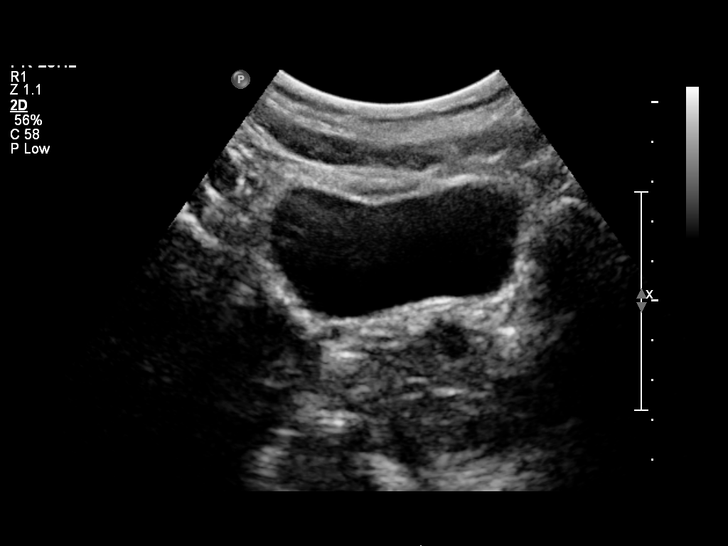
[im 11/60]
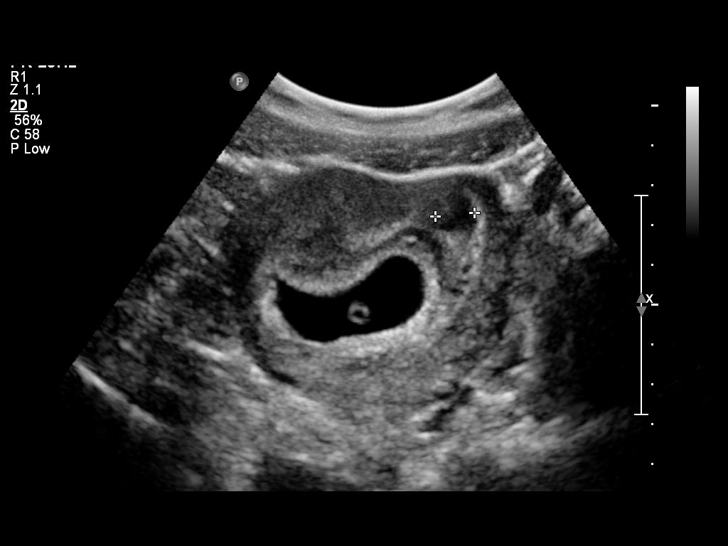
[im 16/60]
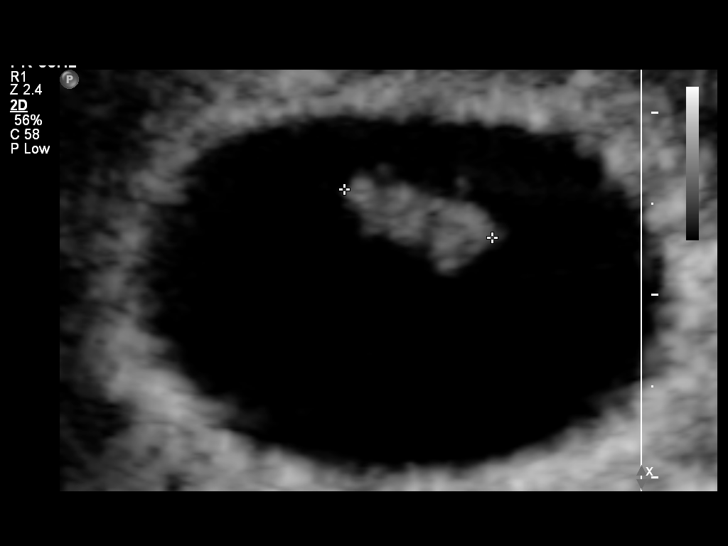
[im 20/60]
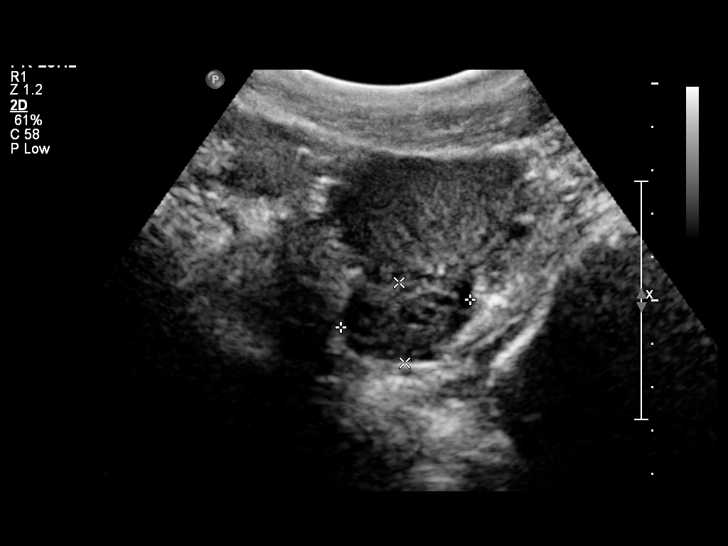
[im 25/60]
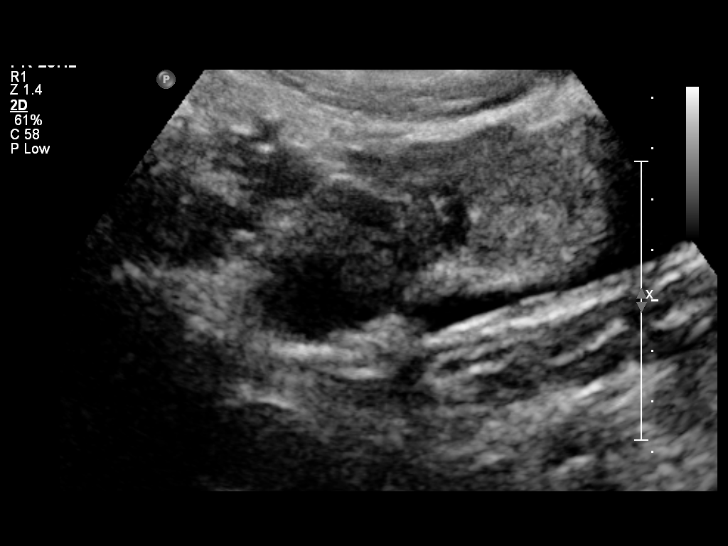
[im 31/60]
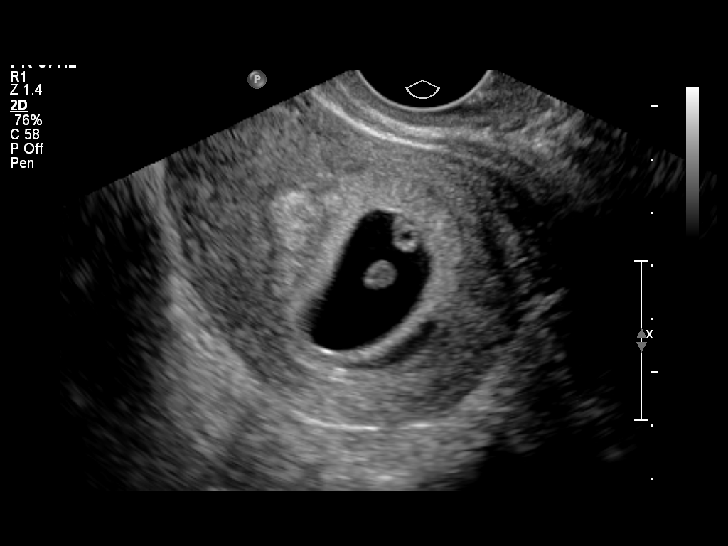
[im 35/60]
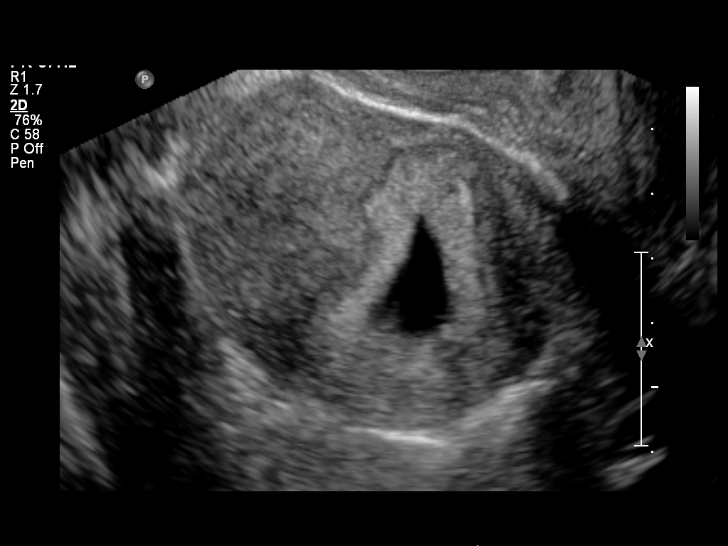
[im 40/60]
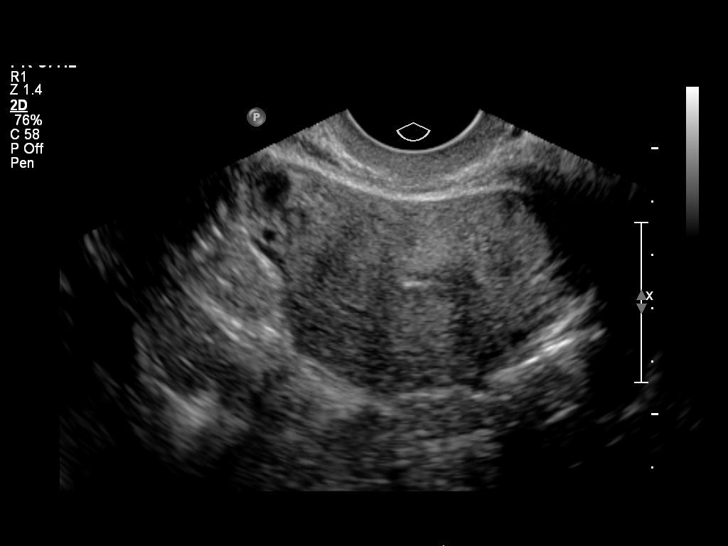
[im 44/60]
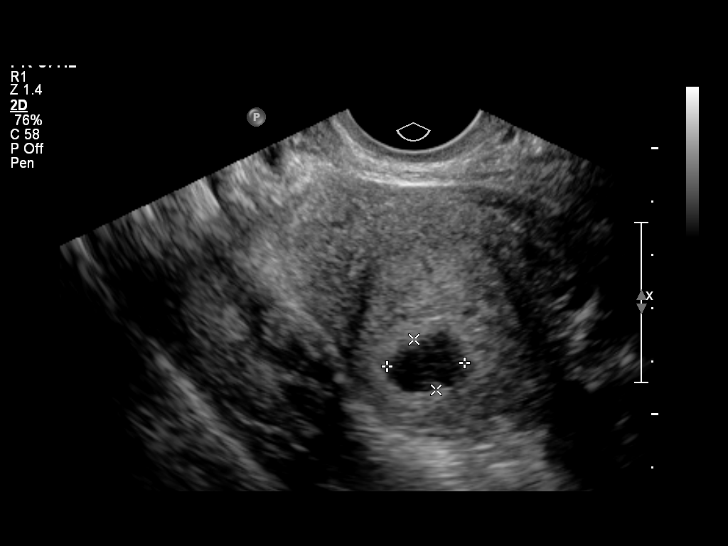
[im 49/60]
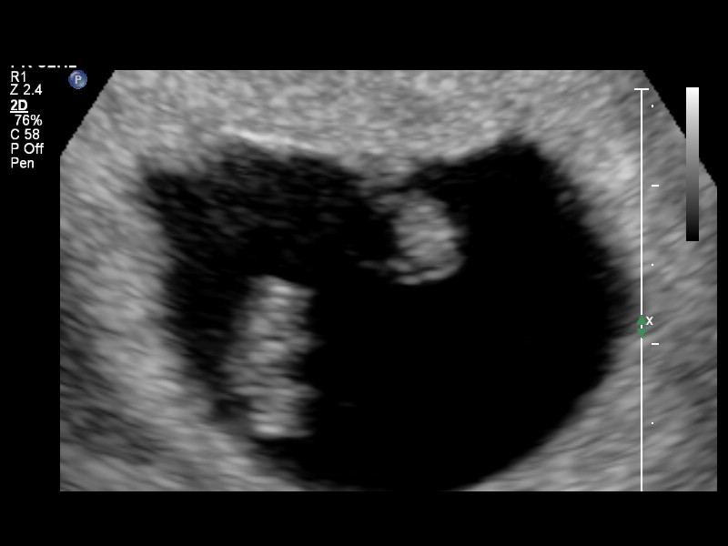
[im 53/60]
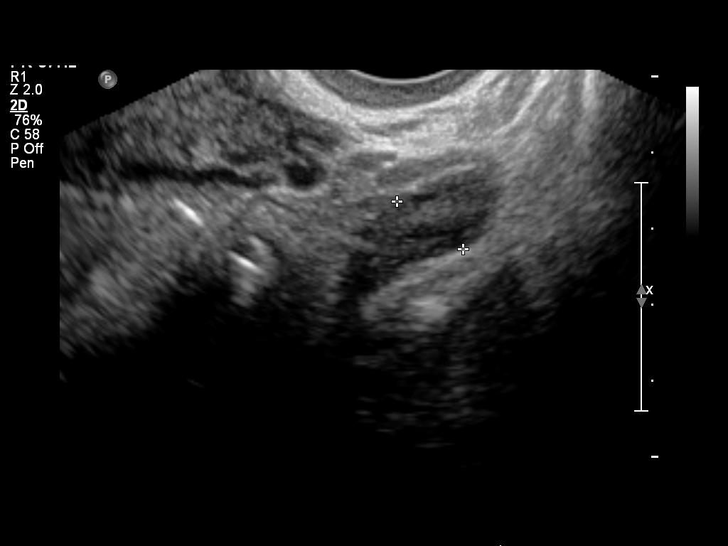
[im 57/60]
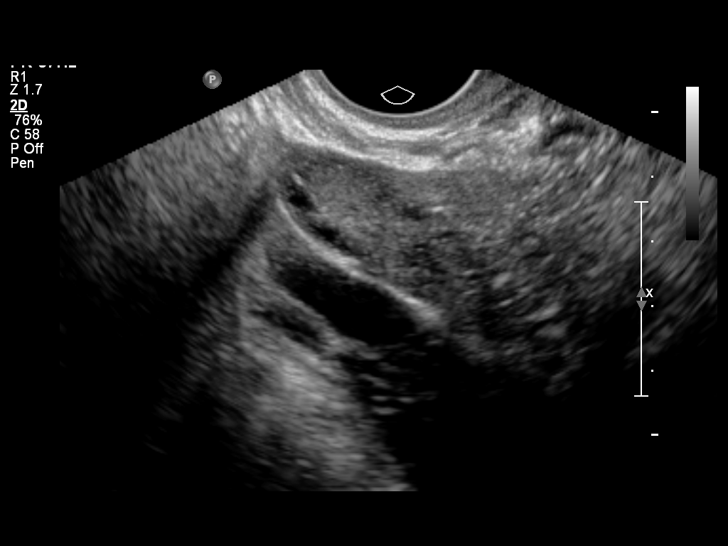

[13 of 28 positions shown; findings below may reference images not displayed]

FINDINGS: A twin intrauterine pregnancy is identified. There appears to be a
single gestational sac consistent with monochorionic twin. The
amnion is identified consistent with diamniotic twin. Two yolk sacs
are visualized.

TWIN 1

Intrauterine gestational sac: Visualized/normal in shape.

Yolk sac:  Present.

Embryo:  Present.

Cardiac Activity: Observed.

Heart Rate: 136 bpm

CRL:  8.8  mm   7 w 0 d                  US EDC: 09/14/2015

TWIN 2

Intrauterine gestational sac: Visualized/normal in shape.

Yolk sac:  Present.

Embryo:  Present.

Cardiac Activity: Observed.

Heart Rate: 127 bpm

CRL:  9.9  mm   7 w 1 d                  US EDC: 09/15/2015

Maternal uterus/adnexae: Uterus is anteverted. No myometrial mass
lesions. Small subchorionic hemorrhage is demonstrated. Both ovaries
are visualized and appear normal. No abnormal adnexal masses. No
free pelvic fluid.
IMPRESSION: Twin pregnancy, appears to represent a monochorionic diamniotic twin
gestation. Estimated gestational age by crown-rump length is 7 weeks
0 days for twin 1 and 7 weeks 1 day for twin 2. A small subchorionic
hemorrhage is identified.

## 2018-07-18 ENCOUNTER — Ambulatory Visit (INDEPENDENT_AMBULATORY_CARE_PROVIDER_SITE_OTHER): Payer: BLUE CROSS/BLUE SHIELD | Admitting: Obstetrics and Gynecology

## 2018-07-18 ENCOUNTER — Encounter: Payer: Self-pay | Admitting: Obstetrics and Gynecology

## 2018-07-18 ENCOUNTER — Other Ambulatory Visit (HOSPITAL_COMMUNITY)
Admission: RE | Admit: 2018-07-18 | Discharge: 2018-07-18 | Disposition: A | Discharge status: Home / Self Care | Payer: BLUE CROSS/BLUE SHIELD | Source: Ambulatory Visit | Attending: Obstetrics and Gynecology | Admitting: Obstetrics and Gynecology

## 2018-07-18 VITALS — BP 108/71 | HR 69 | Ht 60.0 in | Wt 117.0 lb

## 2018-07-18 DIAGNOSIS — Z01419 Encounter for gynecological examination (general) (routine) without abnormal findings: Secondary | ICD-10-CM | POA: Insufficient documentation

## 2018-07-18 NOTE — Progress Notes (Signed)
GYNECOLOGY ANNUAL PREVENTATIVE CARE ENCOUNTER NOTE  Subjective:   Olivia [name] is a 22 y.o. [phone] female here for a annual gynecologic exam. Current complaints: wants to check on IUD. Has occasional pain during intercourse with deep thrusting. Also has some irritation and discomfort after intercourse that goes away after about an hour. Denies discharge. Denies other issues with pelvic pain. Has very light, irregular bleeding that is not bothersome to her. Denies discharge, pelvic pain, or other gynecologic concerns.   Declines STI testing.  Has some anxiety, had depression after delivery of twins but doing much better now and coping well, no current concerns about depression/anxiety.    Gynecologic History Patient's last menstrual period was 07/11/2018 (approximate). Contraception: IUD Last Pap: 2016. Results were: normal Last mammogram: n/a Gardisil: unsure if has received  Obstetric History OB History  Gravida Para Term Preterm AB Living  2 2 1 1  0 3  SAB TAB Ectopic Multiple Live Births  0 0 0 1 3    # Outcome Date GA Lbr Len/2nd Weight Sex Delivery Anes PTL Lv  2A Preterm 07/27/15 [redacted]w[redacted]d   M CS-LTranv   LIV     Complications: Pre-eclampsia  2B Preterm 07/27/15 [redacted]w[redacted]d   M CS-LTranv  Y LIV     Complications: Pre-eclampsia  1 Term 10/22/12 [redacted]w[redacted]d  6 lb 10.9 oz (3.03 kg) M CS-LTranv EPI  LIV     Complications: Eclampsia    Past Medical History:  Diagnosis Date  . Bipolar 1 disorder (HCC)   . Chronic kidney disease    Frequent kidney stones; stent placed 2012  . Eclampsia during pregnancy   . Seizures (HCC) 10-21-2012   x 2 ; No prior history    Past Surgical History:  Procedure Laterality Date  . APPENDECTOMY    . CESAREAN SECTION  10/22/2012   Procedure: CESAREAN SECTION;  Surgeon: Levi Aland, MD;  Location: [facility];  Service: Obstetrics;  Laterality: N/A;  . CESAREAN SECTION  07/27/2015  . CYSTOSCOPY WITH STENT PLACEMENT  2012    Current Outpatient  Medications on File Prior to Visit  Medication Sig Dispense Refill  . levonorgestrel (MIRENA) 20 MCG/24HR IUD 1 each by Intrauterine route once.     No current facility-administered medications on file prior to visit.     No Known Allergies  Social History   Socioeconomic History  . Marital status: Legally Separated    Spouse name: Not on file  . Number of children: Not on file  . Years of education: Not on file  . Highest education level: Not on file  Occupational History  . Not on file  Social Needs  . Financial resource strain: Not on file  . Food insecurity:    Worry: Not on file    Inability: Not on file  . Transportation needs:    Medical: Not on file    Non-medical: Not on file  Tobacco Use  . Smoking status: Former Smoker    Last attempt to quit: 04/22/2018    Years since quitting: 0.2  . Smokeless tobacco: Never Used  Substance and Sexual Activity  . Alcohol use: Yes    Comment: occasionally  . Drug use: No  . Sexual activity: Yes    Birth control/protection: IUD  Lifestyle  . Physical activity:    Days per week: Not on file    Minutes per session: Not on file  . Stress: Not on file  Relationships  . Social connections:  Talks on phone: Not on file    Gets together: Not on file    Attends religious service: Not on file    Active member of club or organization: Not on file    Attends meetings of clubs or organizations: Not on file    Relationship status: Not on file  . Intimate partner violence:    Fear of current or ex partner: Not on file    Emotionally abused: Not on file    Physically abused: Not on file    Forced sexual activity: Not on file  Other Topics Concern  . Not on file  Social History Narrative  . Not on file    Family History  Problem Relation Age of Onset  . Hyperlipidemia Mother   . Coronary artery disease Mother [id number]       S/P CABG  . Other Neg Hx    Diet: could be better Exercise: not exercising  The following portions  of the patient's history were reviewed and updated as appropriate: allergies, current medications, past family history, past medical history, past social history, past surgical history and problem list.  Review of Systems Pertinent items are noted in HPI.   Objective:  BP 108/71   Pulse 69   Ht 5' (1.524 m)   Wt 117 lb (53.1 kg)   LMP 07/11/2018 (Approximate)   BMI 22.85 kg/m  CONSTITUTIONAL: Well-developed, well-nourished female in no acute distress.  HENT:  Normocephalic, atraumatic, External right and left ear normal. Oropharynx is clear and moist EYES: Conjunctivae and EOM are normal. Pupils are equal, round, and reactive to light. No scleral icterus.  NECK: Normal range of motion, supple, no masses.  Normal thyroid.  SKIN: Skin is warm and dry. No rash noted. Not diaphoretic. No erythema. No pallor. NEUROLOGIC: Alert and oriented to person, place, and time. Normal reflexes, muscle tone coordination. No cranial nerve deficit noted. PSYCHIATRIC: Normal mood and affect. Normal behavior. Normal judgment and thought content. CARDIOVASCULAR: Normal heart rate noted, regular rhythm RESPIRATORY: Clear to auscultation bilaterally. Effort and breath sounds normal, no problems with respiration noted. BREASTS: Symmetric in size. No masses, skin changes, nipple drainage, or lymphadenopathy. ABDOMEN: Soft, normal bowel sounds, no distention noted.  No tenderness, rebound or guarding.  PELVIC: Normal appearing external genitalia; normal appearing vaginal mucosa and cervix.  No abnormal discharge noted.  Blue IUD strings seen protruding from cervix. Pap smear obtained.  Normal uterine size, no other palpable masses, no uterine tenderness, very mild right adnexal tenderness. MUSCULOSKELETAL: Normal range of motion. No tenderness.  No cyanosis, clubbing, or edema. 2+ distal pulses.   Assessment and Plan:   1. Well woman exam Declines STI testing Happy with contraception - Cytology - PAP  2.  Vaginal irritation Advised use of lubrication as needed  Will follow up results of pap smear and manage accordingly. Encouraged improvement in diet and exercise.    Routine preventative health maintenance measures emphasized. Please refer to After Visit Summary for other counseling recommendations.    Baldemar LenisK. Meryl Buffi Ewton, M.D. [facility]

## 2018-07-18 NOTE — Progress Notes (Signed)
Pt is G2P3 here for annual exam. Pt has Mirena IUD, inserted 08/2015. Pt states she does get periods with her IUD but they are not regular. Pt states her last pap smear was in 2016, always had normal paps. Pt declines any STD testing today.

## 2018-07-20 LAB — CYTOLOGY - PAP: Diagnosis: NEGATIVE

## 2018-08-05 ENCOUNTER — Other Ambulatory Visit: Payer: Self-pay

## 2018-08-05 ENCOUNTER — Emergency Department (HOSPITAL_COMMUNITY)
Admission: EM | Admit: 2018-08-05 | Discharge: 2018-08-05 | Disposition: A | Discharge status: Home / Self Care | Payer: BLUE CROSS/BLUE SHIELD | Attending: Emergency Medicine | Admitting: Emergency Medicine

## 2018-08-05 ENCOUNTER — Encounter (HOSPITAL_COMMUNITY): Payer: Self-pay

## 2018-08-05 ENCOUNTER — Emergency Department (HOSPITAL_COMMUNITY): Payer: BLUE CROSS/BLUE SHIELD

## 2018-08-05 DIAGNOSIS — Z87891 Personal history of nicotine dependence: Secondary | ICD-10-CM | POA: Insufficient documentation

## 2018-08-05 DIAGNOSIS — N83201 Unspecified ovarian cyst, right side: Secondary | ICD-10-CM | POA: Insufficient documentation

## 2018-08-05 DIAGNOSIS — R109 Unspecified abdominal pain: Secondary | ICD-10-CM

## 2018-08-05 DIAGNOSIS — Z79899 Other long term (current) drug therapy: Secondary | ICD-10-CM | POA: Diagnosis not present

## 2018-08-05 DIAGNOSIS — R102 Pelvic and perineal pain: Secondary | ICD-10-CM | POA: Diagnosis present

## 2018-08-05 LAB — URINALYSIS, ROUTINE W REFLEX MICROSCOPIC
BILIRUBIN URINE: NEGATIVE
Glucose, UA: NEGATIVE mg/dL
HGB URINE DIPSTICK: NEGATIVE
KETONES UR: NEGATIVE mg/dL
Leukocytes, UA: NEGATIVE
NITRITE: NEGATIVE
PH: 6 (ref 5.0–8.0)
Protein, ur: NEGATIVE mg/dL
SPECIFIC GRAVITY, URINE: 1.012 (ref 1.005–1.030)

## 2018-08-05 LAB — CBC
HEMATOCRIT: 43.8 % (ref 36.0–46.0)
HEMOGLOBIN: 14.3 g/dL (ref 12.0–15.0)
MCH: 29.7 pg (ref 26.0–34.0)
MCHC: 32.6 g/dL (ref 30.0–36.0)
MCV: 90.9 fL (ref 78.0–100.0)
Platelets: 232 10*3/uL (ref 150–400)
RBC: 4.82 MIL/uL (ref 3.87–5.11)
RDW: 12.4 % (ref 11.5–15.5)
WBC: 8.8 10*3/uL (ref 4.0–10.5)

## 2018-08-05 LAB — BASIC METABOLIC PANEL
Anion gap: 8 (ref 5–15)
BUN: 11 mg/dL (ref 6–20)
CALCIUM: 8.7 mg/dL — AB (ref 8.9–10.3)
CHLORIDE: 106 mmol/L (ref 98–111)
CO2: 21 mmol/L — AB (ref 22–32)
CREATININE: 0.74 mg/dL (ref 0.44–1.00)
GFR calc Af Amer: >60 mL/min (ref >60)
GFR calc non Af Amer: >60 mL/min (ref >60)
Glucose, Bld: 91 mg/dL (ref 70–99)
Potassium: 4.3 mmol/L (ref 3.5–5.1)
Sodium: 135 mmol/L (ref 135–145)

## 2018-08-05 LAB — I-STAT BETA HCG BLOOD, ED (MC, WL, AP ONLY)

## 2018-08-05 MED ORDER — MELOXICAM 15 MG PO TABS: 15.0000 mg | ORAL_TABLET | Freq: Every day | ORAL | 0 refills | Status: DC

## 2018-08-05 MED ORDER — ONDANSETRON 4 MG PO TBDP
4.0000 mg | ORAL_TABLET | Freq: Once | ORAL | Status: AC
  Administered 2018-08-05: 4 mg via ORAL
  Filled 2018-08-05: qty 1

## 2018-08-05 MED ORDER — OXYCODONE-ACETAMINOPHEN 5-325 MG PO TABS
1.0000 | ORAL_TABLET | ORAL | Status: DC | PRN
  Administered 2018-08-05: 1 via ORAL
  Filled 2018-08-05: qty 1

## 2018-08-05 MED ORDER — FENTANYL CITRATE (PF) 100 MCG/2ML IJ SOLN
50.0000 ug | Freq: Once | INTRAMUSCULAR | Status: AC
  Administered 2018-08-05: 50 ug via INTRAVENOUS
  Filled 2018-08-05: qty 2

## 2018-08-05 NOTE — ED Notes (Signed)
Patient returned from CT. Patient up to bathroom, assisted by family.

## 2018-08-05 NOTE — ED Notes (Signed)
Patient transported to CT 

## 2018-08-05 NOTE — ED Provider Notes (Signed)
[facility]   CSN: [id number] Arrival date & time: 08/05/18  0859     History   Chief Complaint Chief Complaint  Patient presents with  . Flank Pain    HPI  Olivia [name] is a 22 y.o. female presents the emergency department with chief complaint of right flank pain.  Patient states that she got up to and had sudden onset of severe pain in her urethra and right flank described as stabbing, sharp, 10/10. She states that she has a hx of Kidney stones and this feels the same. She has a hx of previous urethral stent and lithotripsy.    HPI  Past Medical History:  Diagnosis Date  . Bipolar 1 disorder (HCC)   . Chronic kidney disease    Frequent kidney stones; stent placed 2012  . Eclampsia during pregnancy   . Seizures (HCC) 10-21-2012   x 2 ; No prior history    Patient Active Problem List   Diagnosis Date Noted  . Acute blood loss anemia 10/29/2012  . Flash pulmonary edema (HCC) 10/27/2012  . Eclampsia 10/21/12 (seizures x 2) 10/27/2012  . S/P LTSC cesarean section 10/22/12 @ 38 wks 10/27/2012  . HTN (hypertension) 10/27/2012    Past Surgical History:  Procedure Laterality Date  . APPENDECTOMY    . CESAREAN SECTION  10/22/2012   Procedure: CESAREAN SECTION;  Surgeon: Levi AlandMark E Anderson, MD;  Location: [facility];  Service: Obstetrics;  Laterality: N/A;  . CESAREAN SECTION  07/27/2015  . CYSTOSCOPY WITH STENT PLACEMENT  2012     OB History    Gravida  2   Para  2   Term  1   Preterm  1   AB  0   Living  3     SAB  0   TAB  0   Ectopic  0   Multiple  1   Live Births  3            Home Medications    Prior to Admission medications   Medication Sig Start Date End Date Taking? Authorizing Provider  levonorgestrel (MIRENA) 20 MCG/24HR IUD 1 each by Intrauterine route once.    [provider]  meloxicam (MOBIC) 15 MG tablet Take 1 tablet (15 mg total) by mouth daily. Take 1 daily with  food. 08/05/18   Arthor CaptainHarris, Signe Tackitt, PA-C    Family History Family History  Problem Relation Age of Onset  . Hyperlipidemia Mother   . Coronary artery disease Mother 2935       S/P CABG  . Other Neg Hx     Social History Social History   Tobacco Use  . Smoking status: Former Smoker    Last attempt to quit: 04/22/2018    Years since quitting: 0.2  . Smokeless tobacco: Never Used  Substance Use Topics  . Alcohol use: Yes    Comment: occasionally  . Drug use: No     Allergies   Patient has no known allergies.   Review of Systems Review of Systems Ten systems reviewed and are negative for acute change, except as noted in the HPI.    Physical Exam Updated Vital Signs BP 96/74   Pulse 64   Temp 98.2 F (36.8 C) (Oral)   Resp 14   Ht 5' (1.524 m)   Wt 54.4 kg   LMP 07/11/2018 (Approximate)   SpO2 100%   BMI 23.44 kg/m   Physical Exam  Constitutional: She is  oriented to person, place, and time. She appears well-developed and well-nourished. No distress.  HENT:  Head: Normocephalic and atraumatic.  Eyes: Conjunctivae are normal. No scleral icterus.  Neck: Normal range of motion.  Cardiovascular: Normal rate, regular rhythm and normal heart sounds. Exam reveals no gallop and no friction rub.  No murmur heard. Pulmonary/Chest: Effort normal and breath sounds normal. No respiratory distress.  Abdominal: Soft. Bowel sounds are normal. She exhibits no distension and no mass. There is no tenderness. There is no guarding.  No cva tenderness  Neurological: She is alert and oriented to person, place, and time.  Skin: Skin is warm and dry. She is not diaphoretic.  Psychiatric: Her behavior is normal.  Nursing note and vitals reviewed.    ED Treatments / Results  Labs (all labs ordered are listed, but only abnormal results are displayed) Labs Reviewed  URINALYSIS, ROUTINE W REFLEX MICROSCOPIC - Abnormal; Notable for the following components:      Result Value    APPearance HAZY (*)    All other components within normal limits  BASIC METABOLIC PANEL - Abnormal; Notable for the following components:   CO2 21 (*)    Calcium 8.7 (*)    All other components within normal limits  CBC  I-STAT BETA HCG BLOOD, ED ([facility], [facility], [facility] ONLY)    EKG None  Radiology Ct Renal Stone Study  Result Date: 08/05/2018 CLINICAL DATA:  22 year old female with history of pelvic pain since 8 a.m. this morning. Nausea. History of kidney stones. EXAM: CT ABDOMEN AND PELVIS WITHOUT CONTRAST TECHNIQUE: Multidetector CT imaging of the abdomen and pelvis was performed following the standard protocol without IV contrast. COMPARISON:  None. FINDINGS: Lower chest: Unremarkable. Hepatobiliary: No definite cystic or solid hepatic lesions are confidently identified on today's noncontrast CT examination. Unenhanced appearance of the gallbladder is normal. Pancreas: No definite pancreatic mass or peripancreatic fluid or inflammatory changes are noted on today's noncontrast CT examination. Spleen: Unremarkable. Adrenals/Urinary Tract: 2 mm nonobstructive calculus in the upper pole collecting system of the left kidney. No additional calculi are noted within the right renal collecting system, along the course of either ureter, or within the lumen of the urinary bladder. No hydroureteronephrosis. Urinary bladder is normal in appearance. Bilateral adrenal glands are normal in appearance. Stomach/Bowel: Unenhanced appearance of the stomach is normal. No pathologic dilatation of small bowel or colon. Status post appendectomy. Vascular/Lymphatic: No atherosclerotic calcifications noted in the abdominal aorta or pelvic vasculature. No lymphadenopathy noted in the abdomen or pelvis. Reproductive: IUD present in the endometrial canal. Ovaries are poorly demonstrated on today's noncontrast CT examination, better generally unremarkable in appearance. Other: Small amount of low-intermediate attenuation (22 HU) free  fluid adjacent to the right adnexal region. No larger volume of ascites. No pneumoperitoneum. Musculoskeletal: There are no aggressive appearing lytic or blastic lesions noted in the visualized portions of the skeleton. IMPRESSION: 1. 2 mm nonobstructive calculus in the upper pole collecting system of the left kidney. No ureteral stones or findings of urinary tract obstruction. 2. Small volume of free fluid in the right hemipelvis lateral to the right adnexa. This is favored to be physiologic, potentially related to recent ovarian cyst rupture. Electronically Signed   By: Trudie Reed M.D.   On: 08/05/2018 11:47    Procedures Procedures (including critical care time)  Medications Ordered in ED Medications  oxyCODONE-acetaminophen (PERCOCET/ROXICET) 5-325 MG per tablet 1 tablet (1 tablet Oral Given 08/05/18 0910)  ondansetron (ZOFRAN-ODT) disintegrating tablet 4 mg (4  mg Oral Given 08/05/18 0910)  fentaNYL (SUBLIMAZE) injection 50 mcg (50 mcg Intravenous Given 08/05/18 0931)     Initial Impression / Assessment and Plan / ED Course  I have reviewed the triage vital signs and the nursing notes.  Pertinent labs & imaging results that were available during my care of the patient were reviewed by me and considered in my medical decision making (see chart for details).     Patient onset of flank pain.  Urine without red blood cells or crystals.  Her lab work is otherwise normal without evidence of elevation in creatinine or BUN.  Patient is not pregnant.  Her CT scan shows some fluid in the right pelvic region which appears to be physiologic and is most likely secondary to a recent ruptured cyst.  The patient is pain-free at this time without tenderness the abdomen.  She is not pregnant cyst so I have no suspicion for ectopic pregnancy.  I doubt ovarian torsion.  Given the fact that she has no pain at this time I do not feel that we need to proceed with pelvic examination and that would be low yield.   She does not have a white blood cell count is elevated, fever or other concerning symptoms for PID or TOA.  Feel that the patient can follow-up with OB/GYN.  She appears appropriate for discharge at this time.  Case discussed with Dr. Fredderick Phenix is in agreement with plan for discharge.  Final Clinical Impressions(s) / ED Diagnoses   Final diagnoses:  Flank pain  Cyst of right ovary    ED Discharge Orders         Ordered    meloxicam (MOBIC) 15 MG tablet  Daily     08/05/18 1237           Arthor Captain, PA-C 08/05/18 1541    Rolan Bucco, MD 08/05/18 1601

## 2018-08-05 NOTE — Discharge Instructions (Signed)
Contact a health care provider if: °Your periods are late, irregular, or painful, or they stop. °You have pelvic pain that does not go away. °You have pressure on your bladder or trouble emptying your bladder completely. °You have pain during sex. °You have any of the following in your abdomen: °A feeling of fullness. °Pressure. °Discomfort. °Pain that does not go away. °Swelling. °You feel generally ill. °You become constipated. °You lose your appetite. °You develop severe acne. °You start to have more body hair and facial hair. °You are gaining weight or losing weight without changing your exercise and eating habits. °You think you may be pregnant. °Get help right away if: °You have abdominal pain that is severe or gets worse. °You cannot eat or drink without vomiting. °You suddenly develop a fever. °Your menstrual period is much heavier than usual. °

## 2018-08-05 NOTE — ED Triage Notes (Signed)
Pt endorses right flank and pelvic pain that feels like a kidney stone. Pt has hx of kidney stones and this feels the same. Had a stent placed for kidney tones 7 years ago. VSS

## 2018-08-05 NOTE — ED Notes (Signed)
Pt ambulatory to restroom with NAD, gait steady.

## 2018-08-16 ENCOUNTER — Ambulatory Visit: Payer: Self-pay | Admitting: Obstetrics and Gynecology

## 2019-02-08 ENCOUNTER — Telehealth (HOSPITAL_COMMUNITY): Payer: Self-pay | Admitting: Psychiatry

## 2019-03-27 DIAGNOSIS — N200 Calculus of kidney: Secondary | ICD-10-CM | POA: Insufficient documentation

## 2019-04-12 ENCOUNTER — Ambulatory Visit: Payer: BLUE CROSS/BLUE SHIELD | Admitting: Obstetrics and Gynecology

## 2020-08-17 ENCOUNTER — Other Ambulatory Visit: Payer: Self-pay | Admitting: Physician Assistant

## 2020-08-17 ENCOUNTER — Ambulatory Visit (HOSPITAL_COMMUNITY)
Admission: RE | Admit: 2020-08-17 | Discharge: 2020-08-17 | Disposition: A | Discharge status: Home / Self Care | Payer: 59 | Source: Ambulatory Visit | Attending: Pulmonary Disease | Admitting: Pulmonary Disease

## 2020-08-17 DIAGNOSIS — U071 COVID-19: Secondary | ICD-10-CM

## 2020-08-17 DIAGNOSIS — Z720 Tobacco use: Secondary | ICD-10-CM | POA: Insufficient documentation

## 2020-08-17 MED ORDER — SODIUM CHLORIDE 0.9 % IV SOLN
1200.0000 mg | Freq: Once | INTRAVENOUS | Status: AC
  Administered 2020-08-17: 1200 mg via INTRAVENOUS

## 2020-08-17 MED ORDER — DIPHENHYDRAMINE HCL 50 MG/ML IJ SOLN: 50.0000 mg | Freq: Once | INTRAMUSCULAR | Status: DC | PRN

## 2020-08-17 MED ORDER — ALBUTEROL SULFATE HFA 108 (90 BASE) MCG/ACT IN AERS: 2.0000 | INHALATION_SPRAY | Freq: Once | RESPIRATORY_TRACT | Status: DC | PRN

## 2020-08-17 MED ORDER — SODIUM CHLORIDE 0.9 % IV SOLN: INTRAVENOUS | Status: DC | PRN

## 2020-08-17 MED ORDER — EPINEPHRINE 0.3 MG/0.3ML IJ SOAJ: 0.3000 mg | Freq: Once | INTRAMUSCULAR | Status: DC | PRN

## 2020-08-17 MED ORDER — FAMOTIDINE IN NACL 20-0.9 MG/50ML-% IV SOLN: 20.0000 mg | Freq: Once | INTRAVENOUS | Status: DC | PRN

## 2020-08-17 MED ORDER — METHYLPREDNISOLONE SODIUM SUCC 125 MG IJ SOLR: 125.0000 mg | Freq: Once | INTRAMUSCULAR | Status: DC | PRN

## 2020-08-17 NOTE — Progress Notes (Signed)
  Diagnosis: COVID-19  Physician: Dr. Wright   Procedure: [facility] Infusion [facility] Med: casirivimab\imdevimab infusion - Provided patient with casirivimab\imdevimab fact sheet for patients, parents and caregivers prior to infusion.  Complications: No immediate complications noted.  Discharge: Discharged home   Olivia Goedken  [name] 08/17/2020   

## 2020-08-17 NOTE — Discharge Instructions (Signed)

## 2020-08-17 NOTE — Progress Notes (Signed)
I connected by phone with Olivia [name] on 08/17/2020 at 9:03 AM to discuss the potential use of a new treatment for mild to moderate COVID-19 viral infection in non-hospitalized patients.  This patient is a 24 y.o. female that meets the FDA criteria for Emergency Use Authorization of COVID monoclonal antibody casirivimab/imdevimab.  Has a (+) direct SARS-CoV-2 viral test result  Has mild or moderate COVID-19   Is NOT hospitalized due to COVID-19  Is within 10 days of symptom onset  Has at least one of the high risk factor(s) for progression to severe COVID-19 and/or hospitalization as defined in EUA.  Specific high risk criteria : Other high risk medical condition per CDC:  smoking history and social vulnerability   I have spoken and communicated the following to the patient or parent/caregiver regarding COVID monoclonal antibody treatment:  1. FDA has authorized the emergency use for the treatment of mild to moderate COVID-19 in adults and pediatric patients with positive results of direct SARS-CoV-2 viral testing who are 66 years of age and older weighing at least 40 kg, and who are at high risk for progressing to severe COVID-19 and/or hospitalization.  2. The significant known and potential risks and benefits of COVID monoclonal antibody, and the extent to which such potential risks and benefits are unknown.  3. Information on available alternative treatments and the risks and benefits of those alternatives, including clinical trials.  4. Patients treated with COVID monoclonal antibody should continue to self-isolate and use infection control measures (e.g., wear mask, isolate, social distance, avoid sharing personal items, clean and disinfect "high touch" surfaces, and frequent handwashing) according to CDC guidelines.   5. The patient or parent/caregiver has the option to accept or refuse COVID monoclonal antibody treatment.  After reviewing this information with the patient, the  patient has agreed to receive one of the available covid 19 monoclonal antibodies and will be provided an appropriate fact sheet prior to infusion.  Sx onset 9/23. Set up for infusion on 9/16 @ 10:30am. Directions given to [facility]. Pt is aware that insurance will be charged an infusion fee. Pt is unvaccinated.   Cline Crock 08/17/2020 9:03 AM

## 2020-12-10 ENCOUNTER — Ambulatory Visit (INDEPENDENT_AMBULATORY_CARE_PROVIDER_SITE_OTHER): Payer: 59 | Admitting: Nurse Practitioner

## 2020-12-10 ENCOUNTER — Encounter: Payer: Self-pay | Admitting: Nurse Practitioner

## 2020-12-10 ENCOUNTER — Other Ambulatory Visit: Payer: Self-pay

## 2020-12-10 ENCOUNTER — Encounter: Payer: Self-pay | Admitting: Obstetrics

## 2020-12-10 ENCOUNTER — Other Ambulatory Visit (HOSPITAL_COMMUNITY)
Admission: RE | Admit: 2020-12-10 | Discharge: 2020-12-10 | Disposition: A | Discharge status: Home / Self Care | Payer: 59 | Source: Ambulatory Visit | Attending: Nurse Practitioner | Admitting: Nurse Practitioner

## 2020-12-10 VITALS — BP 107/64 | HR 88 | Wt 111.6 lb

## 2020-12-10 DIAGNOSIS — Z30433 Encounter for removal and reinsertion of intrauterine contraceptive device: Secondary | ICD-10-CM | POA: Insufficient documentation

## 2020-12-10 DIAGNOSIS — Z30430 Encounter for insertion of intrauterine contraceptive device: Secondary | ICD-10-CM | POA: Diagnosis not present

## 2020-12-10 LAB — POCT URINE PREGNANCY: Preg Test, Ur: NEGATIVE

## 2020-12-10 MED ORDER — LEVONORGESTREL 20 MCG/24HR IU IUD: INTRAUTERINE_SYSTEM | Freq: Once | INTRAUTERINE | Status: AC

## 2020-12-10 NOTE — Progress Notes (Signed)
Pt presents for IUD removal and reinsertion.  Mirena expired in 2016 - UPT neg  Pt wants the Mirena again

## 2020-12-10 NOTE — Progress Notes (Addendum)
Olivia [name] 24 y.o. CC: removal and reinsertion of Mirena IUD (inserted in 2016)    IUD Insertion Procedure Note Patient identified, informed consent performed, consent signed.   Discussed risks of irregular bleeding, cramping, infection, malpositioning or misplacement of the IUD outside the uterus which may require further procedure such as laparoscopy. Time out was performed.  Urine pregnancy test negative.  Bimanual - uterus is anteverted.  Speculum placed in the vagina.  Cervix visualized.  IUD removed without difficulty.  Cervix cleaned with Betadine x 3.  Hurricane spray used.  Grasped posteriorly with a single tooth tenaculum and tension given during sound and IUD placement.  Uterus sounded to 8 cm.  Mirena IUD placed per manufacturer's recommendations.  Strings trimmed to 2-3 cm. Tenaculum was removed, good hemostasis noted.  Patient tolerated procedure well.   Patient was given post-procedure instructions.  She was advised to have backup contraception for one week.  Patient was also asked to check IUD strings periodically and follow up in 4 weeks for IUD check.  Nolene Bernheim, RN, MSN, NP-BC Nurse Practitioner, [employer] for [employer], [facility] 12/10/2020 3:36 PM

## 2020-12-11 LAB — URINE CYTOLOGY ANCILLARY ONLY
Chlamydia: NEGATIVE
Comment: NEGATIVE
Comment: NORMAL
Neisseria Gonorrhea: NEGATIVE

## 2021-01-07 ENCOUNTER — Ambulatory Visit: Payer: 59

## 2021-06-08 ENCOUNTER — Emergency Department (HOSPITAL_BASED_OUTPATIENT_CLINIC_OR_DEPARTMENT_OTHER): Payer: 59

## 2021-06-08 ENCOUNTER — Emergency Department (HOSPITAL_BASED_OUTPATIENT_CLINIC_OR_DEPARTMENT_OTHER)
Admission: EM | Admit: 2021-06-08 | Discharge: 2021-06-08 | Disposition: A | Discharge status: Home / Self Care | Payer: 59 | Attending: Emergency Medicine | Admitting: Emergency Medicine

## 2021-06-08 ENCOUNTER — Encounter (HOSPITAL_BASED_OUTPATIENT_CLINIC_OR_DEPARTMENT_OTHER): Payer: Self-pay

## 2021-06-08 ENCOUNTER — Other Ambulatory Visit: Payer: Self-pay

## 2021-06-08 DIAGNOSIS — Z87891 Personal history of nicotine dependence: Secondary | ICD-10-CM | POA: Insufficient documentation

## 2021-06-08 DIAGNOSIS — M25572 Pain in left ankle and joints of left foot: Secondary | ICD-10-CM | POA: Diagnosis not present

## 2021-06-08 DIAGNOSIS — N189 Chronic kidney disease, unspecified: Secondary | ICD-10-CM | POA: Insufficient documentation

## 2021-06-08 DIAGNOSIS — X501XXA Overexertion from prolonged static or awkward postures, initial encounter: Secondary | ICD-10-CM | POA: Insufficient documentation

## 2021-06-08 DIAGNOSIS — S99922A Unspecified injury of left foot, initial encounter: Secondary | ICD-10-CM | POA: Insufficient documentation

## 2021-06-08 DIAGNOSIS — I129 Hypertensive chronic kidney disease with stage 1 through stage 4 chronic kidney disease, or unspecified chronic kidney disease: Secondary | ICD-10-CM | POA: Diagnosis not present

## 2021-06-08 DIAGNOSIS — T1490XA Injury, unspecified, initial encounter: Secondary | ICD-10-CM

## 2021-06-08 MED ORDER — NAPROXEN 500 MG PO TABS: 500.0000 mg | ORAL_TABLET | Freq: Two times a day (BID) | ORAL | 0 refills | Status: DC

## 2021-06-08 NOTE — Discharge Instructions (Addendum)
Please continue to wear the brace for the next 2 weeks at all times except for when you are sleeping or in the shower.  Take the naproxen twice daily for the next week.  This is an anti-inflammatory that will help reduce inflammation.  Please take it with food and water as this medication can be tough to the stomach lining.  I also gave the information to [facility] if you require further orthopedic intervention.  If the pain does not improve within the next few weeks I want you to follow-up with them.

## 2021-06-08 NOTE — ED Triage Notes (Signed)
Pt tripped and fell yesterday, foot rolled under her. C/o left foot pain, difficulty ambulating.

## 2021-06-08 NOTE — ED Provider Notes (Signed)
[facility] EMERGENCY DEPARTMENT Provider Note   CSN: [id number] Arrival date & time: 06/08/21  1000     History Chief Complaint  Patient presents with   Foot Injury    Olivia [name] is a 25 y.o. female.   Foot Injury  Patient presents with pain to the left foot.  States she rolled her ankle yesterday and its been hard to ambulate.  She is able to ambulate although with some pain.  She is taken ibuprofen with minimal relief.  She is also tried icing it which has helped a little bit.  She is not having pain of her calf in any way.  No history of surgeries on that ankle or foot.  Pain is primarily to the dorsal aspect of her left ankle.  Past Medical History:  Diagnosis Date   Bipolar 1 disorder (HCC)    Chronic kidney disease    Frequent kidney stones; stent placed 2012   Eclampsia during pregnancy    Seizures (HCC) 10-21-2012   x 2 ; No prior history    Patient Active Problem List   Diagnosis Date Noted   Kidney stone 03/27/2019   Incisional hernia, without obstruction or gangrene 04/14/2016   History of eclampsia 02/14/2015   Acute blood loss anemia 10/29/2012   Flash pulmonary edema (HCC) 10/27/2012   Eclampsia 10/21/12 (seizures x 2) 10/27/2012   S/P LTSC cesarean section 10/22/12 @ 38 wks 10/27/2012   HTN (hypertension) 10/27/2012    Past Surgical History:  Procedure Laterality Date   APPENDECTOMY     CESAREAN SECTION  10/22/2012   Procedure: CESAREAN SECTION;  Surgeon: Levi Aland, MD;  Location: [facility];  Service: Obstetrics;  Laterality: N/A;   CESAREAN SECTION  07/27/2015   CYSTOSCOPY WITH STENT PLACEMENT  2012     OB History     Gravida  2   Para  2   Term  1   Preterm  1   AB  0   Living  3      SAB  0   IAB  0   Ectopic  0   Multiple  1   Live Births  3           Family History  Problem Relation Age of Onset   Hyperlipidemia Mother    Coronary artery disease Mother 16       S/P CABG   Other Neg Hx      Social History   Tobacco Use   Smoking status: Former    Types: Cigarettes    Quit date: 04/22/2018    Years since quitting: 3.1   Smokeless tobacco: Never  Vaping Use   Vaping Use: Every day  Substance Use Topics   Alcohol use: Yes    Comment: occasionally   Drug use: No    Home Medications Prior to Admission medications   Medication Sig Start Date End Date Taking? Authorizing Provider  atomoxetine (STRATTERA) 10 MG capsule Take 10 mg by mouth daily.    [provider]  citalopram (CELEXA) 10 MG tablet Take 10 mg by mouth daily. 12/01/20   [provider]  levonorgestrel (MIRENA) 20 MCG/24HR IUD 1 each by Intrauterine route once.    [provider]  meloxicam (MOBIC) 15 MG tablet Take 1 tablet (15 mg total) by mouth daily. Take 1 daily with food. 08/05/18   Arthor Captain, PA-C    Allergies    Patient has no known allergies.  Review of Systems  Review of Systems  Musculoskeletal:  Positive for gait problem. Negative for arthralgias.       Foot pain   Physical Exam Updated Vital Signs BP 122/71 (BP Location: Right Arm)   Pulse 91   Temp 98.3 F (36.8 C) (Oral)   Resp 18   Ht 5' (1.524 m)   Wt 54.4 kg   SpO2 100%   BMI 23.44 kg/m   Physical Exam Vitals and nursing note reviewed. Exam conducted with a chaperone present.  Constitutional:      General: She is not in acute distress.    Appearance: Normal appearance.  HENT:     Head: Normocephalic and atraumatic.  Eyes:     General: No scleral icterus.    Extraocular Movements: Extraocular movements intact.     Pupils: Pupils are equal, round, and reactive to light.  Musculoskeletal:        General: Tenderness present. Normal range of motion.     Comments: Tenderness to left foot  Skin:    Coloration: Skin is not jaundiced.  Neurological:     Mental Status: She is alert. Mental status is at baseline.     Coordination: Coordination normal.     Comments: Cranial nerves II through  XII intact.  She is able to ambulate, although she has had difficulty due to pain in the left foot.  She has sensation to left foot as well as full range of motion of the ankle and toes.  DP and PT are 2+ bilaterally    ED Results / Procedures / Treatments   Labs (all labs ordered are listed, but only abnormal results are displayed) Labs Reviewed - No data to display  EKG None  Radiology DG Foot Complete Left  Result Date: 06/08/2021 CLINICAL DATA:  Fall.  Foot injury. EXAM: LEFT FOOT - COMPLETE 3+ VIEW COMPARISON:  None. FINDINGS: There is no evidence of fracture or dislocation. There is no evidence of arthropathy or other focal bone abnormality. Soft tissues are unremarkable. IMPRESSION: Negative. Electronically Signed   By: Kennith Center M.D.   On: 06/08/2021 11:10    Procedures Procedures   Medications Ordered in ED Medications - No data to display  ED Course  I have reviewed the triage vital signs and the nursing notes.  Pertinent labs & imaging results that were available during my care of the patient were reviewed by me and considered in my medical decision making (see chart for details).    MDM Rules/Calculators/A&P                          Will order radiograph of the left foot given the tenderness on exam.  Her vitals are stable, I do not suspect a septic joint given the acute nature of the pain as well as the mechanism of injury.  I do not suspect gout.  Radiograph does not show any acute fracture or dislocation.  We will discharge the patient with a splint and 5 days anti-inflammatory.  Orthopedic follow-up given.  Discussed return precautions with the patient who verbalized understanding. Final Clinical Impression(s) / ED Diagnoses Final diagnoses:  Injury    Rx / DC Orders ED Discharge Orders     None        Theron Arista, Cordelia Poche 06/08/21 1920    Milagros Loll, MD 06/10/21 [phone]

## 2022-02-06 ENCOUNTER — Encounter (HOSPITAL_BASED_OUTPATIENT_CLINIC_OR_DEPARTMENT_OTHER): Payer: Self-pay | Admitting: *Deleted

## 2022-02-06 ENCOUNTER — Other Ambulatory Visit: Payer: Self-pay

## 2022-02-06 ENCOUNTER — Emergency Department (HOSPITAL_BASED_OUTPATIENT_CLINIC_OR_DEPARTMENT_OTHER)
Admission: EM | Admit: 2022-02-06 | Discharge: 2022-02-06 | Disposition: A | Discharge status: Home / Self Care | Payer: BC Managed Care – PPO | Attending: Student | Admitting: Student

## 2022-02-06 DIAGNOSIS — Z87891 Personal history of nicotine dependence: Secondary | ICD-10-CM | POA: Diagnosis not present

## 2022-02-06 DIAGNOSIS — T1512XA Foreign body in conjunctival sac, left eye, initial encounter: Secondary | ICD-10-CM | POA: Diagnosis present

## 2022-02-06 DIAGNOSIS — T1592XA Foreign body on external eye, part unspecified, left eye, initial encounter: Secondary | ICD-10-CM

## 2022-02-06 DIAGNOSIS — I129 Hypertensive chronic kidney disease with stage 1 through stage 4 chronic kidney disease, or unspecified chronic kidney disease: Secondary | ICD-10-CM | POA: Diagnosis not present

## 2022-02-06 DIAGNOSIS — X58XXXA Exposure to other specified factors, initial encounter: Secondary | ICD-10-CM | POA: Diagnosis not present

## 2022-02-06 DIAGNOSIS — D631 Anemia in chronic kidney disease: Secondary | ICD-10-CM | POA: Diagnosis not present

## 2022-02-06 DIAGNOSIS — Z87442 Personal history of urinary calculi: Secondary | ICD-10-CM | POA: Diagnosis not present

## 2022-02-06 DIAGNOSIS — N189 Chronic kidney disease, unspecified: Secondary | ICD-10-CM | POA: Insufficient documentation

## 2022-02-06 MED ORDER — OXYCODONE-ACETAMINOPHEN 5-325 MG PO TABS: 1.0000 | ORAL_TABLET | Freq: Four times a day (QID) | ORAL | 0 refills | Status: DC | PRN

## 2022-02-06 MED ORDER — TETRACAINE HCL 0.5 % OP SOLN
1.0000 [drp] | Freq: Once | OPHTHALMIC | Status: AC
  Administered 2022-02-06: 1 [drp] via OPHTHALMIC
  Filled 2022-02-06: qty 4

## 2022-02-06 MED ORDER — ERYTHROMYCIN 5 MG/GM OP OINT
TOPICAL_OINTMENT | Freq: Once | OPHTHALMIC | Status: AC
  Administered 2022-02-06: 1 via OPHTHALMIC
  Filled 2022-02-06: qty 3.5

## 2022-02-06 MED ORDER — OFLOXACIN 0.3 % OP SOLN
1.0000 [drp] | Freq: Four times a day (QID) | OPHTHALMIC | Status: DC
  Administered 2022-02-06: 1 [drp] via OPHTHALMIC
  Filled 2022-02-06: qty 5

## 2022-02-06 MED ORDER — NAPROXEN 500 MG PO TABS: 500.0000 mg | ORAL_TABLET | Freq: Two times a day (BID) | ORAL | 0 refills | Status: DC

## 2022-02-06 MED ORDER — NAPROXEN 250 MG PO TABS
375.0000 mg | ORAL_TABLET | Freq: Once | ORAL | Status: AC
  Administered 2022-02-06: 375 mg via ORAL
  Filled 2022-02-06: qty 2

## 2022-02-06 MED ORDER — OXYCODONE-ACETAMINOPHEN 5-325 MG PO TABS
1.0000 | ORAL_TABLET | Freq: Once | ORAL | Status: AC
  Administered 2022-02-06: 1 via ORAL
  Filled 2022-02-06: qty 1

## 2022-02-06 MED ORDER — FLUORESCEIN SODIUM 1 MG OP STRP
ORAL_STRIP | OPHTHALMIC | Status: AC
  Filled 2022-02-06: qty 1

## 2022-02-06 NOTE — ED Provider Notes (Signed)
?[facility] EMERGENCY DEPARTMENT ?Provider Note ? ?CSN: [id number] ?Arrival date & time: 02/06/22 1915 ? ?Chief Complaint(s) ?Eye Pain (Nail glue in left eye) ? ?HPI ?Olivia [name] is a 26 y.o. female with PMH bipolar 1, frequent kidney stones, eclampsia with prolonged hospital stay during pregnancy who presents emergency department for evaluation of glue in the left eye.  Patient states that she was reaching for eyedrops but instead grabbed nail glue and dropped it in her left eye.  She immediately flushed the eye with warm water for 5 to 10 minutes prior to arrival but arrives with significant left eye pain and photophobia.  Endorses mild blurry vision but denies chest pain, shortness breath, abdominal pain with nausea, vomiting or other systemic symptoms ? ? ?Eye Pain ? ? ?Past Medical History ?Past Medical History:  ?Diagnosis Date  ? Bipolar 1 disorder (HCC)   ? Chronic kidney disease   ? Frequent kidney stones; stent placed 2012  ? Eclampsia during pregnancy   ? Seizures (HCC) 10-21-2012  ? x 2 ; No prior history  ? ?Patient Active Problem List  ? Diagnosis Date Noted  ? Kidney stone 03/27/2019  ? Incisional hernia, without obstruction or gangrene 04/14/2016  ? History of eclampsia 02/14/2015  ? Acute blood loss anemia 10/29/2012  ? Flash pulmonary edema (HCC) 10/27/2012  ? Eclampsia 10/21/12 (seizures x 2) 10/27/2012  ? S/P LTSC cesarean section 10/22/12 @ 38 wks 10/27/2012  ? HTN (hypertension) 10/27/2012  ? ?Home Medication(s) ?Prior to Admission medications   ?Medication Sig Start Date End Date Taking? Authorizing Provider  ?naproxen (NAPROSYN) 500 MG tablet Take 1 tablet (500 mg total) by mouth 2 (two) times daily. 02/06/22  Yes Brette Cast, MD  ?oxyCODONE-acetaminophen (PERCOCET/ROXICET) 5-325 MG tablet Take 1 tablet by mouth every 6 (six) hours as needed for severe pain. 02/06/22  Yes Audri Kozub, MD  ?atomoxetine (STRATTERA) 10 MG capsule Take 10 mg by mouth daily.    [provider]  ?citalopram (CELEXA) 10 MG tablet Take 10 mg by mouth daily. 12/01/20   [provider]  ?levonorgestrel (MIRENA) 20 MCG/24HR IUD 1 each by Intrauterine route once.    [provider]  ?                                                                                                                                  ?Past Surgical History ?Past Surgical History:  ?Procedure Laterality Date  ? APPENDECTOMY    ? CESAREAN SECTION  10/22/2012  ? Procedure: CESAREAN SECTION;  Surgeon: Levi Aland, MD;  Location: [facility];  Service: Obstetrics;  Laterality: N/A;  ? CESAREAN SECTION  07/27/2015  ? CYSTOSCOPY WITH STENT PLACEMENT  2012  ? ?Family History ?Family History  ?Problem Relation Age of Onset  ? Hyperlipidemia Mother   ? Coronary artery disease Mother 76  ?     S/P CABG  ? Other Neg Hx   ? ? ?  Social History ?Social History  ? ?Tobacco Use  ? Smoking status: Former  ?  Types: Cigarettes  ?  Quit date: 04/22/2018  ?  Years since quitting: 3.7  ? Smokeless tobacco: Never  ?Vaping Use  ? Vaping Use: Every day  ?Substance Use Topics  ? Alcohol use: Yes  ?  Comment: occasionally  ? Drug use: No  ? ?Allergies ?Patient has no known allergies. ? ?Review of Systems ?Review of Systems  ?Eyes:  Positive for photophobia, pain, redness and visual disturbance.  ? ?Physical Exam ?Vital Signs  ?I have reviewed the triage vital signs ?BP 116/84   Pulse 90   Temp 98.3 ?F (36.8 ?C)   Resp 15   Ht 5' (1.524 m)   Wt 47.6 kg   SpO2 99%   Breastfeeding No   BMI 20.51 kg/m?  ? ?Physical Exam ?Vitals and nursing note reviewed.  ?Constitutional:   ?   General: She is not in acute distress. ?   Appearance: She is well-developed.  ?HENT:  ?   Head: Normocephalic and atraumatic.  ?Eyes:  ?   Comments: Left eye conjunctival injection, fluorescein with diffuse uptake in the inferior aspect of the sclera and iris, no overt abrasion  ?Cardiovascular:  ?   Rate and Rhythm: Normal rate and regular rhythm.  ?    Heart sounds: No murmur heard. ?Pulmonary:  ?   Effort: Pulmonary effort is normal. No respiratory distress.  ?   Breath sounds: Normal breath sounds.  ?Abdominal:  ?   Palpations: Abdomen is soft.  ?   Tenderness: There is no abdominal tenderness.  ?Musculoskeletal:     ?   General: No swelling.  ?   Cervical back: Neck supple.  ?Skin: ?   General: Skin is warm and dry.  ?   Capillary Refill: Capillary refill takes less than 2 seconds.  ?Neurological:  ?   Mental Status: She is alert.  ?Psychiatric:     ?   Mood and Affect: Mood normal.  ? ? ?ED Results and Treatments ?Labs ?(all labs ordered are listed, but only abnormal results are displayed) ?Labs Reviewed - No data to display                                                                                                                       ? ?Radiology ?No results found. ? ?Pertinent labs & imaging results that were available during my care of the patient were reviewed by me and considered in my medical decision making (see MDM for details). ? ?Medications Ordered in ED ?Medications  ?ofloxacin (OCUFLOX) 0.3 % ophthalmic solution 1 drop (1 drop Left Eye Given 02/06/22 2030)  ?tetracaine (PONTOCAINE) 0.5 % ophthalmic solution 1 drop (1 drop Left Eye Given by Other 02/06/22 2008)  ?fluorescein 1 MG ophthalmic strip (  Given by Other 02/06/22 2025)  ?erythromycin ophthalmic ointment (1 application. Right Eye Given 02/06/22 2030)  ?naproxen (NAPROSYN) tablet 375 mg (375 mg Oral Given  02/06/22 2051)  ?oxyCODONE-acetaminophen (PERCOCET/ROXICET) 5-325 MG per tablet 1 tablet (1 tablet Oral Given 02/06/22 2052)  ?                                                               ?                                                                    ?Procedures ?Procedures ? ?(including critical care time) ? ?Medical Decision Making / ED Course ? ? ?This patient presents to the ED for concern of foreign body in eye, this involves an extensive number of treatment options, and is a  complaint that carries with it a high risk of complications and morbidity.  The differential diagnosis includes retained foreign body, chemical burn, abrasion ? ?MDM: ?Patient seen emergency department for evaluation of glue in the eye.  Physical exam with conjunctival injection on the left, pupil equal and reactive but with photophobia, fluorescein but diffuse uptake on the inferior aspect of the eye but no overt abrasion.  Ophthalmology consulted who recommended initiation of erythromycin ointment and ofloxacin drops and a urgent follow-up in 2 days in his clinic.  Patient given short course of pain meds and drops and ointment given to the patient at bedside.  Patient then dc. ? ? ?Additional history obtained: ? ?-External records from outside source obtained and reviewed including: Chart review including previous notes, labs, imaging, consultation notes ? ? ?Lab Tests: ?-I ordered, reviewed, and interpreted labs.   ?The pertinent results include:   ?Labs Reviewed - No data to display  ? ? ? ? ?Medicines ordered and prescription drug management: ?Meds ordered this encounter  ?Medications  ? tetracaine (PONTOCAINE) 0.5 % ophthalmic solution 1 drop  ? fluorescein 1 MG ophthalmic strip  ?  Pat KocherWheatley, Natosha D: cabinet override  ? ofloxacin (OCUFLOX) 0.3 % ophthalmic solution 1 drop  ? erythromycin ophthalmic ointment  ? naproxen (NAPROSYN) tablet 375 mg  ? oxyCODONE-acetaminophen (PERCOCET/ROXICET) 5-325 MG per tablet 1 tablet  ? oxyCODONE-acetaminophen (PERCOCET/ROXICET) 5-325 MG tablet  ?  Sig: Take 1 tablet by mouth every 6 (six) hours as needed for severe pain.  ?  Dispense:  8 tablet  ?  Refill:  0  ? naproxen (NAPROSYN) 500 MG tablet  ?  Sig: Take 1 tablet (500 mg total) by mouth 2 (two) times daily.  ?  Dispense:  30 tablet  ?  Refill:  0  ?  ?-I have reviewed the patients home medicines and have made adjustments as needed ? ?Critical interventions ?none ? ?Consultations Obtained: ?I requested consultation  with the ophthlamology,  and discussed lab and imaging findings as well as pertinent plan - they recommend: Erythromycin ointment, ofloxacin drops and outpatient follow-up ? ? ?Cardiac Monitoring: ?[facility]

## 2022-02-06 NOTE — ED Notes (Signed)
Patient discharged to home.  All discharge instructions reviewed.  Patient verbalized understanding via teachback method.  VS WDL.  Respirations even and unlabored.  Ambulatory out of ED.   °

## 2022-02-06 NOTE — Discharge Instructions (Signed)
You were seen in the emergency department for evaluation of left eye pain after glue got in your eye.  I spoke with the ophthalmologist who is agreed to see you on Monday morning at 815.  In the meantime we will treat with antibiotics both an ointment and a drop.  Please use these 3 times daily.  A short course of pain medication will be available for breakthrough pain only.  Please take the Naprosyn as prescribed twice daily to keep the pain at bay and the oxycodone is for breakthrough pain.  Return to the emergency department if you have new or worsening changes in vision, uncontrolled pain or any other concerning symptoms. ?

## 2022-02-06 NOTE — ED Triage Notes (Signed)
Pt states she accidentally put nail glue in her eye 30 mins pta. States she rinsed her eye under running water for approx 5-7 mins pta. Pt able to open left eye and states she can see. Redness noted ?

## 2022-02-08 ENCOUNTER — Ambulatory Visit (INDEPENDENT_AMBULATORY_CARE_PROVIDER_SITE_OTHER): Payer: BC Managed Care – PPO | Admitting: Ophthalmology

## 2022-02-08 ENCOUNTER — Other Ambulatory Visit: Payer: Self-pay

## 2022-02-08 ENCOUNTER — Encounter (INDEPENDENT_AMBULATORY_CARE_PROVIDER_SITE_OTHER): Payer: Self-pay | Admitting: Ophthalmology

## 2022-02-08 DIAGNOSIS — T1501XA Foreign body in cornea, right eye, initial encounter: Secondary | ICD-10-CM

## 2022-02-08 DIAGNOSIS — H1830 Unspecified corneal membrane change: Secondary | ICD-10-CM | POA: Diagnosis not present

## 2022-02-08 DIAGNOSIS — T2661XA Corrosion of cornea and conjunctival sac, right eye, initial encounter: Secondary | ICD-10-CM | POA: Diagnosis not present

## 2022-02-08 DIAGNOSIS — H3581 Retinal edema: Secondary | ICD-10-CM

## 2022-02-08 NOTE — Progress Notes (Incomplete)
?[facility] Note ? ?02/09/2022 ? ?  ? ?CHIEF COMPLAINT ?Patient presents for No chief complaint on file. ? ? ?HISTORY OF PRESENT ILLNESS: ?Olivia [name] is a 26 y.o. female who presents to the clinic today for:  ? ? ?On Saturday, pt accidentally put nail glue in her eye instead of eye drops, she states she immediately rinsed the eye and went to the ED, she states the ED started her on Ofloxacin TID and EES ung TID, she feels like the pain and drainage is worse since Saturday, but vision is about the same ? ?Referring physician: ?Aura Dials, PA-C ?[place] ?[place],   [id number] ? ?HISTORICAL INFORMATION:  ? ?Selected notes from the [facility] ?Referred by ED for nail glue in eye ?LEE:  ?Ocular Hx- ?PMH- ?  ? ?CURRENT MEDICATIONS: ?No current outpatient medications on file. (Ophthalmic Drugs)  ? ?No current facility-administered medications for this visit. (Ophthalmic Drugs)  ? ?Current Outpatient Medications (Other)  ?Medication Sig  ? atomoxetine (STRATTERA) 10 MG capsule Take 10 mg by mouth daily.  ? citalopram (CELEXA) 10 MG tablet Take 10 mg by mouth daily.  ? clonazePAM (KLONOPIN) 0.5 MG tablet Take 0.5 mg by mouth daily. As needed  ? levonorgestrel (MIRENA) 20 MCG/24HR IUD 1 each by Intrauterine route once.  ? naproxen (NAPROSYN) 500 MG tablet Take 1 tablet (500 mg total) by mouth 2 (two) times daily.  ? oxyCODONE-acetaminophen (PERCOCET/ROXICET) 5-325 MG tablet Take 1 tablet by mouth every 6 (six) hours as needed for severe pain.  ? ?No current facility-administered medications for this visit. (Other)  ? ? ? ? ?REVIEW OF SYSTEMS: ? ? ? ? ?ALLERGIES ?No Known Allergies ? ?PAST MEDICAL HISTORY ?Past Medical History:  ?Diagnosis Date  ? Bipolar 1 disorder ([place])   ? Chronic kidney disease   ? Frequent kidney stones; stent placed 2012  ? Eclampsia during pregnancy   ? Seizures ([place]) 10-21-2012  ? x 2 ; No prior history  ? ?Past Surgical History:  ?Procedure  Laterality Date  ? APPENDECTOMY    ? CESAREAN SECTION  10/22/2012  ? Procedure: CESAREAN SECTION;  Surgeon: Olga [place]ers, MD;  Location: [facility];  Service: Obstetrics;  Laterality: N/A;  ? CESAREAN SECTION  07/27/2015  ? CYSTOSCOPY WITH STENT PLACEMENT  2012  ? ? ?FAMILY HISTORY ?Family History  ?Problem Relation Age of Onset  ? Hyperlipidemia Mother   ? Coronary artery disease Mother 80  ?     S/P CABG  ? Other Neg Hx   ? ? ?SOCIAL HISTORY ?Social History  ? ?Tobacco Use  ? Smoking status: Former  ?  Types: Cigarettes  ? Smokeless tobacco: Never  ?Vaping Use  ? Vaping Use: Every day  ?Substance Use Topics  ? Alcohol use: Yes  ?  Comment: occasionally  ? Drug use: No  ? ?  ? ?  ? ?OPHTHALMIC EXAM: ? ?Not recorded ?  ? ? ?IMAGING AND PROCEDURES  ?Imaging and Procedures for 02/09/2022 ? ? ? ?  ?  ? ?  ?ASSESSMENT/PLAN: ? ?  ICD-10-CM   ?1. Abrasion of left cornea, initial encounter  S05.02XA   ?  ? ? ?Corneal Abrasion OS ? - pt put nail glue in her eye Saturday, 03.18.23 ? - pt presented to ED immediately following incident ? - was started on Oflaxacin TID and Erythromycin ung TID OS -- increase to QID ? - round epi defect (4.0vx5.0H) ? -  f/u tomorrow ? ? ? ?Ophthalmic Meds Ordered this visit:  ?No orders of the defined types were placed in this encounter. ? ? ?  ? ?No follow-ups on file. ? ?There are no Patient Instructions on file for this visit. ? ? ?Explained the diagnoses, plan, and follow up with the patient and they expressed understanding.  Patient expressed understanding of the importance of proper follow up care.  ? ?This document serves as a record of services personally performed by Gardiner Sleeper, MD, PhD. It was created on their behalf by San Jetty. Owens Shark, OA an ophthalmic technician. The creation of this record is the provider's dictation and/or activities during the visit.   ? ?Electronically signed by: San Jetty. Owens Shark, [place] 03.20.2023 12:20 PM ? ? ?Gardiner Sleeper, M.D., Ph.D. ?Diseases & Surgery of the  Retina and Vitreous ?[place] ? ? ? ? ? ?Abbreviations: ?M myopia (nearsighted); A astigmatism; H hyperopia (farsighted); P presbyopia; Mrx spectacle prescription;  CTL contact lenses; OD right eye; OS left eye; OU both eyes  XT exotropia; ET esotropia; PEK punctate epithelial keratitis; PEE punctate epithelial erosions; DES dry eye syndrome; MGD meibomian gland dysfunction; ATs artificial tears; PFAT's preservative free artificial tears; [place] nuclear sclerotic cataract; PSC posterior subcapsular cataract; ERM epi-retinal membrane; PVD posterior vitreous detachment; RD retinal detachment; DM diabetes mellitus; DR diabetic retinopathy; NPDR non-proliferative diabetic retinopathy; PDR proliferative diabetic retinopathy; CSME clinically significant macular edema; DME diabetic macular edema; dbh dot blot hemorrhages; CWS cotton wool spot; POAG primary open angle glaucoma; C/D cup-to-disc ratio; HVF humphrey visual field; GVF goldmann visual field; OCT optical coherence tomography; IOP intraocular pressure; BRVO Branch retinal vein occlusion; CRVO central retinal vein occlusion; CRAO central retinal artery occlusion; BRAO branch retinal artery occlusion; RT retinal tear; SB scleral buckle; PPV pars plana vitrectomy; VH Vitreous hemorrhage; PRP panretinal laser photocoagulation; IVK intravitreal kenalog; VMT vitreomacular traction; MH Macular hole;  NVD neovascularization of the disc; NVE neovascularization elsewhere; AREDS age related eye disease study; ARMD age related macular degeneration; POAG primary open angle glaucoma; EBMD epithelial/anterior basement membrane dystrophy; ACIOL anterior chamber intraocular lens; IOL intraocular lens; PCIOL posterior chamber intraocular lens; Phaco/IOL phacoemulsification with intraocular lens placement; [place] photorefractive keratectomy; LASIK laser assisted in situ keratomileusis; HTN hypertension; DM diabetes mellitus; COPD chronic obstructive pulmonary  disease ? ?

## 2022-02-08 NOTE — Progress Notes (Signed)
?[facility] Note ? ?02/08/2022 ? ?  ? ?CHIEF COMPLAINT ?Patient presents for Eye Problem ? ? ?HISTORY OF PRESENT ILLNESS: ?Olivia [name] is a 26 y.o. female who presents to the clinic today for:  ? ?HPI   ?Saturday, patient used one drop of nail glue in her eye instead of her eye drop OS.  She went to the hospital and was given Ofloxacin TID and EES ung TID OS.Patient states eye is worse.  She is unable to open eye unless she forces it open with her fingers.  Tearing, FB sensation, discharge, and blurred vision.  ?Patient wears glasses on occasion, states not a strong Rx.  ?Last edited by Leonie Douglas, COA on 02/08/2022  8:55 AM.  ?  ?On Saturday, pt accidentally put nail glue in her eye instead of eye drops, she states she immediately rinsed the eye and went to the ED, she states the ED started her on Ofloxacin TID and EES ung TID, she feels like the pain and drainage is worse since Saturday, but vision is about the same ? ?Referring physician: ?Aura Dials, PA-C ?[place] ?[place],  [place] [id number] ? ?HISTORICAL INFORMATION:  ? ?Selected notes from the [facility] ?Referred by ED for nail glue in eye ?LEE:  ?Ocular Hx- ?PMH- ?  ? ?CURRENT MEDICATIONS: ?No current outpatient medications on file. (Ophthalmic Drugs)  ? ?No current facility-administered medications for this visit. (Ophthalmic Drugs)  ? ?Current Outpatient Medications (Other)  ?Medication Sig  ? atomoxetine (STRATTERA) 10 MG capsule Take 10 mg by mouth daily.  ? citalopram (CELEXA) 10 MG tablet Take 10 mg by mouth daily.  ? clonazePAM (KLONOPIN) 0.5 MG tablet Take 0.5 mg by mouth daily. As needed  ? levonorgestrel (MIRENA) 20 MCG/24HR IUD 1 each by Intrauterine route once.  ? naproxen (NAPROSYN) 500 MG tablet Take 1 tablet (500 mg total) by mouth 2 (two) times daily.  ? oxyCODONE-acetaminophen (PERCOCET/ROXICET) 5-325 MG tablet Take 1 tablet by mouth every 6 (six) hours as needed for severe pain.   ? ?No current facility-administered medications for this visit. (Other)  ? ?REVIEW OF SYSTEMS: ?ROS   ?Positive for: Neurological, Eyes, Psychiatric ?Negative for: Constitutional, Gastrointestinal, Skin, Genitourinary, Musculoskeletal, HENT, Endocrine, Cardiovascular, Respiratory, Allergic/Imm, Heme/Lymph ?Last edited by Leonie Douglas, COA on 02/08/2022  8:52 AM.  ?  ? ?ALLERGIES ?No Known Allergies ? ?PAST MEDICAL HISTORY ?Past Medical History:  ?Diagnosis Date  ? Bipolar 1 disorder ([place])   ? Chronic kidney disease   ? Frequent kidney stones; stent placed 2012  ? Eclampsia during pregnancy   ? Seizures ([place]) 10-21-2012  ? x 2 ; No prior history  ? ?Past Surgical History:  ?Procedure Laterality Date  ? APPENDECTOMY    ? CESAREAN SECTION  10/22/2012  ? Procedure: CESAREAN SECTION;  Surgeon: Olga [facility]ers, MD;  Location: [facility];  Service: Obstetrics;  Laterality: N/A;  ? CESAREAN SECTION  07/27/2015  ? CYSTOSCOPY WITH STENT PLACEMENT  2012  ? ? ?FAMILY HISTORY ?Family History  ?Problem Relation Age of Onset  ? Hyperlipidemia Mother   ? Coronary artery disease Mother 14  ?     S/P CABG  ? Other Neg Hx   ? ?SOCIAL HISTORY ?Social History  ? ?Tobacco Use  ? Smoking status: Former  ?  Types: Cigarettes  ? Smokeless tobacco: Never  ?Vaping Use  ? Vaping Use: Every day  ?Substance Use Topics  ? Alcohol use: Yes  ?  Comment: occasionally  ? Drug use: No  ?  ? ?  ?OPHTHALMIC EXAM: ? ?Base Eye Exam   ? ? Visual Acuity (Snellen - Linear)   ? ?   Right Left  ? Dist [place] 20/20 20/100-  ? Dist ph [place]  unable  ?OS- Unable to do PH, eye is tearing a lot and she is holding eyelids open. ? ?  ?  ? ? Tonometry (Tonopen, 9:01 AM)   ? ?   Right Left  ? Pressure 9 9  ? ?  ?  ? ? Pupils   ? ?   Dark Light Shape React APD  ? Right 3 2 Round Brisk None  ? Left 3 2 Round Brisk None  ? ?  ?  ? ? Visual Fields (Counting fingers)   ? ?   Left Right  ?  Full Full  ? ?  ?  ? ? Extraocular Movement   ? ?   Right Left  ?  Full Full  ? ?  ?  ? ?  Neuro/Psych   ? ? Oriented x3: Yes  ? Mood/Affect: Normal  ? ?  ?  ? ? Dilation   ? ? Both eyes: 1.0% Mydriacyl, 2.5% Phenylephrine @ 9:01 AM  ? ?  ?  ? ?  ? ?Slit Lamp and Fundus Exam   ? ? Slit Lamp Exam   ? ?   Right Left  ? Lids/Lashes Normal dried, hardened glue along lashes and covering UL margin, lost lashes inferior margin  ? Conjunctiva/Sclera White and quiet 1+ Injection inferiorly  ? Cornea Clear Irregular epi defect approx 4.37mmVx5.0mmH inf cornea  ? Anterior Chamber Deep and quiet Deep and quiet  ? Iris Round and reactive Round and dilated  ? Lens Clear Clear  ? Anterior Vitreous Normal Normal  ? ?  ?  ? ? Fundus Exam   ? ?   Right Left  ? Disc Pink and Sharp Pink and Sharp  ? C/D Ratio 0.1 0.1  ? Macula Good foveal reflex, No heme or edema Good foveal reflex, No heme or edema  ? Vessels Normal Normal  ? Periphery Attached, No heme Attached, No heme  ? ?  ?  ? ?  ? ?IMAGING AND PROCEDURES  ?Imaging and Procedures for 02/08/2022 ? ?OCT, Retina - OU - Both Eyes   ? ?   ?Right Eye ?Quality was good. Central Foveal Thickness: 256. Progression has no prior data. Findings include normal foveal contour, no IRF, no SRF.  ? ?Left Eye ?Quality was good. Central Foveal Thickness: 265. Progression has no prior data. Findings include normal foveal contour, no IRF, no SRF.  ? ?Notes ?*Images captured and stored on drive ? ?Diagnosis / Impression:  ?NFP; no IRF/SRF OU ? ? ?Clinical management:  ?See below ? ?Abbreviations: NFP - Normal foveal profile. CME - cystoid macular edema. PED - pigment epithelial detachment. IRF - intraretinal fluid. SRF - subretinal fluid. EZ - ellipsoid zone. ERM - epiretinal membrane. ORA - outer retinal atrophy. ORT - outer retinal tubulation. SRHM - subretinal hyper-reflective material. IRHM - intraretinal hyper-reflective material ? ? ?  ?  ?  ? ?  ?ASSESSMENT/PLAN: ? ?  ICD-10-CM   ?1. Chemical injury to cornea, right, initial encounter  T26.61XA PR REMOVE EYELID FOREIGN BODY,EMBEDDED  ?   ?2. 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  ?3. Retinal edema  H35.81 OCT, Retina - OU - Both Eyes  ?  ? ? ?  1,2. Chemical injury w/ corneal epithelial defect OS ? - pt put nail glue in her eye Saturday, 03.18.23 ? - pt presented to ED immediately following incident ? - was started on Oflaxacin TID and Erythromycin ung TID OS ? - pt reports persistent eye pain, FBS OS, +tearing ? - BCVA 20/100 OS ? - exam shows dried glue on lashes and upper lid margin, abutting cornea ? - +corneal epi defect (4.0vx5.0H) inferiorly ? - pieces of glue abutting cornea were removed at the slit lamp using jeweler's forceps without complication ? - increase ofloxacin gtts and erythromycin ung to QID OS ? - f/u tomorrow for cornea check ? ?Ophthalmic Meds Ordered this visit:  ?No orders of the defined types were placed in this encounter. ?  ? ?Return in about 1 day (around 02/09/2022) for f/u tomorrow at 10:00 for corneal abrasion OS. ? ?There are no Patient Instructions on file for this visit. ? ? ?Explained the diagnoses, plan, and follow up with the patient and they expressed understanding.  Patient expressed understanding of the importance of proper follow up care.  ? ?This document serves as a record of services personally performed by Gardiner Sleeper, MD, PhD. It was created on their behalf by San Jetty. Owens Shark, OA an ophthalmic technician. The creation of this record is the provider's dictation and/or activities during the visit.   ? ?Electronically signed by: San Jetty. Owens Shark, [place] 03.20.2023 12:56 PM ? ?Gardiner Sleeper, M.D., Ph.D. ?Diseases & Surgery of the Retina and Vitreous ?[place] ? ?I have reviewed the above documentation for accuracy and completeness, and I agree with the above. Gardiner Sleeper, M.D., Ph.D. 02/08/22 12:56 PM ? ?Abbreviations: ?M myopia (nearsighted); A astigmatism; H hyperopia (farsighted); P presbyopia; Mrx spectacle prescription;  CTL contact lenses; OD right eye; OS left eye; OU  both eyes  XT exotropia; ET esotropia; PEK punctate epithelial keratitis; PEE punctate epithelial erosions; DES dry eye syndrome; MGD meibomian gland dysfunction; ATs artificial tears; PFAT's preservative free art

## 2022-02-09 ENCOUNTER — Ambulatory Visit (INDEPENDENT_AMBULATORY_CARE_PROVIDER_SITE_OTHER): Payer: BC Managed Care – PPO | Admitting: Ophthalmology

## 2022-02-09 ENCOUNTER — Encounter (INDEPENDENT_AMBULATORY_CARE_PROVIDER_SITE_OTHER): Payer: Self-pay | Admitting: Ophthalmology

## 2022-02-09 DIAGNOSIS — H1830 Unspecified corneal membrane change: Secondary | ICD-10-CM | POA: Diagnosis not present

## 2022-02-09 DIAGNOSIS — T2662XD Corrosion of cornea and conjunctival sac, left eye, subsequent encounter: Secondary | ICD-10-CM

## 2022-02-09 DIAGNOSIS — S0502XA Injury of conjunctiva and corneal abrasion without foreign body, left eye, initial encounter: Secondary | ICD-10-CM

## 2022-02-09 NOTE — Progress Notes (Signed)
?[facility] Note ? ?02/09/2022 ? ?  ? ?CHIEF COMPLAINT ?Patient presents for Retina Follow Up ? ? ?HISTORY OF PRESENT ILLNESS: ?Olivia [name] is a 26 y.o. female who presents to the clinic today for:  ? ?HPI   ? ? Retina Follow Up   ?Patient presents with  Other.  In left eye.  Severity is moderate.  Duration of 1 day.  Since onset it is gradually improving.  I, the attending physician,  performed the HPI with the patient and updated documentation appropriately. ? ?  ?  ? ? Comments   ?Pt here for 1 day ret f/u for corneal abrasion OS, nail glue in eye. Pt states the eye is a bit better today, though still difficult to open all the way and keep open, but a little improvement w/ VA.  ? ?  ?  ?Last edited by Bernarda Caffey, MD on 02/09/2022 12:28 PM.  ?  ?Patient states the pain and scratchiness has improved. Getting better.  ? ?Referring physician: ?Aura Dials, PA-C ?[address] ?[place],  [place] [address] ? ?HISTORICAL INFORMATION:  ? ?Selected notes from the [facility] ?Referred by ED for nail glue in eye ?LEE:  ?Ocular Hx- ?PMH- ?  ? ?CURRENT MEDICATIONS: ?No current outpatient medications on file. (Ophthalmic Drugs)  ? ?No current facility-administered medications for this visit. (Ophthalmic Drugs)  ? ?Current Outpatient Medications (Other)  ?Medication Sig  ? atomoxetine (STRATTERA) 10 MG capsule Take 10 mg by mouth daily.  ? citalopram (CELEXA) 10 MG tablet Take 10 mg by mouth daily.  ? clonazePAM (KLONOPIN) 0.5 MG tablet Take 0.5 mg by mouth daily. As needed  ? levonorgestrel (MIRENA) 20 MCG/24HR IUD 1 each by Intrauterine route once.  ? naproxen (NAPROSYN) 500 MG tablet Take 1 tablet (500 mg total) by mouth 2 (two) times daily.  ? oxyCODONE-acetaminophen (PERCOCET/ROXICET) 5-325 MG tablet Take 1 tablet by mouth every 6 (six) hours as needed for severe pain.  ? ?No current facility-administered medications for this visit. (Other)  ? ?REVIEW OF SYSTEMS: ?ROS    ?Negative for: Constitutional, Gastrointestinal, Neurological, Skin, Genitourinary, Musculoskeletal, HENT, Endocrine, Cardiovascular, Eyes, Respiratory, Psychiatric, Allergic/Imm, Heme/Lymph ?Last edited by Kingsley Spittle, COT on 02/09/2022 10:03 AM.  ?  ? ?ALLERGIES ?No Known Allergies ? ?PAST MEDICAL HISTORY ?Past Medical History:  ?Diagnosis Date  ? Bipolar 1 disorder ([place])   ? Chronic kidney disease   ? Frequent kidney stones; stent placed 2012  ? Eclampsia during pregnancy   ? Seizures ([place]) 10-21-2012  ? x 2 ; No prior history  ? ?Past Surgical History:  ?Procedure Laterality Date  ? APPENDECTOMY    ? CESAREAN SECTION  10/22/2012  ? Procedure: CESAREAN SECTION;  Surgeon: Olga Millers, MD;  Location: [facility];  Service: Obstetrics;  Laterality: N/A;  ? CESAREAN SECTION  07/27/2015  ? CYSTOSCOPY WITH STENT PLACEMENT  2012  ? ? ?FAMILY HISTORY ?Family History  ?Problem Relation Age of Onset  ? Hyperlipidemia Mother   ? Coronary artery disease Mother 72  ?     S/P CABG  ? Other Neg Hx   ? ?SOCIAL HISTORY ?Social History  ? ?Tobacco Use  ? Smoking status: Former  ?  Types: Cigarettes  ? Smokeless tobacco: Never  ?Vaping Use  ? Vaping Use: Every day  ?Substance Use Topics  ? Alcohol use: Yes  ?  Comment: occasionally  ? Drug use: No  ?  ? ?  ?  OPHTHALMIC EXAM: ? ?Base Eye Exam   ? ? Visual Acuity (Snellen - Linear)   ? ?   Right Left  ? Dist [place] def 20/40 +2  ? Dist ph [place]  unable  ? ?  ?  ? ? Tonometry (Tonopen, 10:07 AM)   ? ?   Right Left  ? Pressure def 11  ? ?  ?  ? ? Pupils   ? ?   Dark Light Shape React APD  ? Right 3 2 Round Brisk None  ? Left 3 2 Round Brisk None  ? ?  ?  ? ? Visual Fields (Counting fingers)   ? ?   Left Right  ?  Full Full  ? ?  ?  ? ? Extraocular Movement   ? ?   Right Left  ?  Full, Ortho Full, Ortho  ? ?  ?  ? ? Neuro/Psych   ? ? Oriented x3: Yes  ? Mood/Affect: Normal  ? ?  ?  ? ? Dilation   ?Did NOT dilate OU. ? ?  ?  ? ?  ? ?Slit Lamp and Fundus Exam   ? ? Slit Lamp Exam   ? ?    Right Left  ? Lids/Lashes Normal dried, hardened glue upper lashes -- improving; no glue on lid margins; lost lashes inferior margin  ? Conjunctiva/Sclera White and quiet 1+ Injection diffuse  ? Cornea Clear epi defect improving -- now approx 3.7mmVx2.0mmH inf cornea  ? Anterior Chamber Deep and quiet Deep and clear  ? Iris Round and reactive Round and reactive  ? Lens Clear Clear  ? Anterior Vitreous Normal Normal  ? ?  ?  ? ?  ? ?IMAGING AND PROCEDURES  ?Imaging and Procedures for 02/09/2022 ? ? ? ?  ?  ? ?  ?ASSESSMENT/PLAN: ? ?  ICD-10-CM   ?1. Chemical injury to cornea of left eye, subsequent encounter  T26.62XD   ?  ?2. 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  ? ? ?1,2. Chemical injury w/ corneal epithelial defect OS ?            - pt put nail glue in her eye Saturday, 03.18.23 ?            - pt presented to ED immediately following incident ?            - was started on Oflaxacin TID and Erythromycin ung TID OS -- increased to QID 3.20.23 ?            - pieces of glue abutting cornea were removed at the slit lamp using jeweler's forceps without complication on 2.77.82 ?            - pt reports eye pain, FBS OS, tearing -- improved today ?            - BCVA improved to 20/40 from 20/100 OS ?            - +corneal epi defect smaller -- now 28mmVx2mmH from 4.0vx5.0H inferiorly ?            - cont ofloxacin gtts and erythromycin ung to QID OS ?            - f/u 1 wk, sooner prn ? ? Ophthalmic Meds Ordered this visit:  ?No orders of the defined types were placed in this encounter. ?  ? ?Return Return next Monday or Tuesday. ? ?There are no Patient Instructions on file  for this visit. ? ? ?Explained the diagnoses, plan, and follow up with the patient and they expressed understanding.  Patient expressed understanding of the importance of proper follow up care.  ? ?This document serves as a record of services personally performed by Gardiner Sleeper, MD, PhD. It was created on their behalf by San Jetty. Owens Shark, OA an  ophthalmic technician. The creation of this record is the provider's dictation and/or activities during the visit.   ? ?Electronically signed by: San Jetty. Owens Shark, [place] 03.20.2023 12:31 PM ? ?This document serves as a record of services personally performed by Gardiner Sleeper, MD, PhD. It was created on their behalf by Leonie Douglas, an ophthalmic technician. The creation of this record is the provider's dictation and/or activities during the visit.   ? ?Electronically signed by: Leonie Douglas COA, 02/09/22  12:31 PM ? ?Gardiner Sleeper, M.D., Ph.D. ?Diseases & Surgery of the Retina and Vitreous ?[facility] ? ?I have reviewed the above documentation for accuracy and completeness, and I agree with the above. Gardiner Sleeper, M.D., Ph.D. 02/09/22 12:36 PM ? ? ?Abbreviations: ?M myopia (nearsighted); A astigmatism; H hyperopia (farsighted); P presbyopia; Mrx spectacle prescription;  CTL contact lenses; OD right eye; OS left eye; OU both eyes  XT exotropia; ET esotropia; PEK punctate epithelial keratitis; PEE punctate epithelial erosions; DES dry eye syndrome; MGD meibomian gland dysfunction; ATs artificial tears; PFAT's preservative free artificial tears; [place] nuclear sclerotic cataract; PSC posterior subcapsular cataract; ERM epi-retinal membrane; PVD posterior vitreous detachment; RD retinal detachment; DM diabetes mellitus; DR diabetic retinopathy; NPDR non-proliferative diabetic retinopathy; PDR proliferative diabetic retinopathy; CSME clinically significant macular edema; DME diabetic macular edema; dbh dot blot hemorrhages; CWS cotton wool spot; POAG primary open angle glaucoma; C/D cup-to-disc ratio; HVF humphrey visual field; GVF goldmann visual field; OCT optical coherence tomography; IOP intraocular pressure; BRVO Branch retinal vein occlusion; CRVO central retinal vein occlusion; CRAO central retinal artery occlusion; BRAO branch retinal artery occlusion; RT retinal tear; SB scleral buckle;  PPV pars plana vitrectomy; VH Vitreous hemorrhage; PRP panretinal laser photocoagulation; IVK intravitreal kenalog; VMT vitreomacular traction; MH Macular hole;  NVD neovascularization of the disc; NVE neovascular

## 2022-02-11 NOTE — Progress Notes (Shared)
?[facility] Note ? ?02/16/2022 ? ?  ? ?CHIEF COMPLAINT ?Patient presents for No chief complaint on file. ? ? ?HISTORY OF PRESENT ILLNESS: ?Olivia [name] is a 26 y.o. female who presents to the clinic today for:  ? ? ?Patient states the pain and scratchiness has improved. Getting better.  ? ?Referring physician: ?Teena Irani, PA-C ?[address] ?[place],  [place] [address] ? ?HISTORICAL INFORMATION:  ? ?Selected notes from the MEDICAL RECORD NUMBER ?Referred by ED for nail glue in eye ?LEE:  ?Ocular Hx- ?PMH- ?  ? ?CURRENT MEDICATIONS: ?No current outpatient medications on file. (Ophthalmic Drugs)  ? ?No current facility-administered medications for this visit. (Ophthalmic Drugs)  ? ?Current Outpatient Medications (Other)  ?Medication Sig  ? atomoxetine (STRATTERA) 10 MG capsule Take 10 mg by mouth daily.  ? citalopram (CELEXA) 10 MG tablet Take 10 mg by mouth daily.  ? clonazePAM (KLONOPIN) 0.5 MG tablet Take 0.5 mg by mouth daily. As needed  ? levonorgestrel (MIRENA) 20 MCG/24HR IUD 1 each by Intrauterine route once.  ? naproxen (NAPROSYN) 500 MG tablet Take 1 tablet (500 mg total) by mouth 2 (two) times daily.  ? oxyCODONE-acetaminophen (PERCOCET/ROXICET) 5-325 MG tablet Take 1 tablet by mouth every 6 (six) hours as needed for severe pain.  ? ?No current facility-administered medications for this visit. (Other)  ? ?REVIEW OF SYSTEMS: ? ? ?ALLERGIES ?No Known Allergies ? ?PAST MEDICAL HISTORY ?Past Medical History:  ?Diagnosis Date  ? Bipolar 1 disorder ([facility])   ? Chronic kidney disease   ? Frequent kidney stones; stent placed 2012  ? Eclampsia during pregnancy   ? Seizures ([facility]) 10-21-2012  ? x 2 ; No prior history  ? ?Past Surgical History:  ?Procedure Laterality Date  ? APPENDECTOMY    ? CESAREAN SECTION  10/22/2012  ? Procedure: CESAREAN SECTION;  Surgeon: Levi Aland, MD;  Location: [facility];  Service: Obstetrics;  Laterality: N/A;  ? CESAREAN SECTION  07/27/2015  ?  CYSTOSCOPY WITH STENT PLACEMENT  2012  ? ? ?FAMILY HISTORY ?Family History  ?Problem Relation Age of Onset  ? Hyperlipidemia Mother   ? Coronary artery disease Mother 6  ?     S/P CABG  ? Other Neg Hx   ? ?SOCIAL HISTORY ?Social History  ? ?Tobacco Use  ? Smoking status: Former  ?  Types: Cigarettes  ? Smokeless tobacco: Never  ?Vaping Use  ? Vaping Use: Every day  ?Substance Use Topics  ? Alcohol use: Yes  ?  Comment: occasionally  ? Drug use: No  ?  ? ?  ?OPHTHALMIC EXAM: ? ?Not recorded ?  ? ?IMAGING AND PROCEDURES  ?Imaging and Procedures for 02/16/2022 ? ? ? ?  ?  ? ?  ?ASSESSMENT/PLAN: ? ?  ICD-10-CM   ?1. Chemical injury to cornea of left eye, subsequent encounter  T26.62XD   ?  ?2. 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  ? ? ? ?1,2. Chemical injury w/ corneal epithelial defect OS ?            - pt put nail glue in her eye Saturday, 03.18.23 ?            - pt presented to ED immediately following incident ?            - was started on Oflaxacin TID and Erythromycin ung TID OS -- increased to QID 3.20.23 ?            -  pieces of glue abutting cornea were removed at the slit lamp using jeweler's forceps without complication on 3.20.23 ?            - pt reports eye pain, FBS OS, tearing -- improved today ?            - BCVA improved to 20/40 from 20/100 OS ?            - +corneal epi defect smaller -- now 57mmVx2mmH from 4.0vx5.0H inferiorly ?            - cont ofloxacin gtts and erythromycin ung to QID OS ?            - f/u 1 wk, sooner prn ? ? Ophthalmic Meds Ordered this visit:  ?No orders of the defined types were placed in this encounter. ?  ? ?No follow-ups on file. ? ?There are no Patient Instructions on file for this visit. ? ? ?Explained the diagnoses, plan, and follow up with the patient and they expressed understanding.  Patient expressed understanding of the importance of proper follow up care.  ? ?This document serves as a record of services personally performed by Karie Chimera, MD, PhD. It was  created on their behalf by Joni Reining, an ophthalmic technician. The creation of this record is the provider's dictation and/or activities during the visit.   ? ?Electronically signed by: Joni Reining COA, 02/11/22  12:36 PM ? ? ?Karie Chimera, M.D., Ph.D. ?Diseases & Surgery of the Retina and Vitreous ?[facility] ? ? ? ?Abbreviations: ?M myopia (nearsighted); A astigmatism; H hyperopia (farsighted); P presbyopia; Mrx spectacle prescription;  CTL contact lenses; OD right eye; OS left eye; OU both eyes  XT exotropia; ET esotropia; PEK punctate epithelial keratitis; PEE punctate epithelial erosions; DES dry eye syndrome; MGD meibomian gland dysfunction; ATs artificial tears; PFAT's preservative free artificial tears; NSC nuclear sclerotic cataract; PSC posterior subcapsular cataract; ERM epi-retinal membrane; PVD posterior vitreous detachment; RD retinal detachment; DM diabetes mellitus; DR diabetic retinopathy; NPDR non-proliferative diabetic retinopathy; PDR proliferative diabetic retinopathy; CSME clinically significant macular edema; DME diabetic macular edema; dbh dot blot hemorrhages; CWS cotton wool spot; POAG primary open angle glaucoma; C/D cup-to-disc ratio; HVF humphrey visual field; GVF goldmann visual field; OCT optical coherence tomography; IOP intraocular pressure; BRVO Branch retinal vein occlusion; CRVO central retinal vein occlusion; CRAO central retinal artery occlusion; BRAO branch retinal artery occlusion; RT retinal tear; SB scleral buckle; PPV pars plana vitrectomy; VH Vitreous hemorrhage; PRP panretinal laser photocoagulation; IVK intravitreal kenalog; VMT vitreomacular traction; MH Macular hole;  NVD neovascularization of the disc; NVE neovascularization elsewhere; AREDS age related eye disease study; ARMD age related macular degeneration; POAG primary open angle glaucoma; EBMD epithelial/anterior basement membrane dystrophy; ACIOL anterior chamber intraocular lens;  IOL intraocular lens; PCIOL posterior chamber intraocular lens; Phaco/IOL phacoemulsification with intraocular lens placement; PRK photorefractive keratectomy; LASIK laser assisted in situ keratomileusis; HTN hypertension; DM diabetes mellitus; COPD chronic obstructive pulmonary disease ? ?

## 2022-02-16 ENCOUNTER — Encounter (INDEPENDENT_AMBULATORY_CARE_PROVIDER_SITE_OTHER): Payer: 59 | Admitting: Ophthalmology

## 2022-02-16 DIAGNOSIS — T2662XD Corrosion of cornea and conjunctival sac, left eye, subsequent encounter: Secondary | ICD-10-CM

## 2022-02-16 DIAGNOSIS — H1830 Unspecified corneal membrane change: Secondary | ICD-10-CM

## 2022-02-17 NOTE — Progress Notes (Shared)
?[facility] Note ? ?02/22/2022 ? ?  ? ?CHIEF COMPLAINT ?Patient presents for No chief complaint on file. ? ? ?HISTORY OF PRESENT ILLNESS: ?Olivia [name] is a 26 y.o. female who presents to the clinic today for:  ? ? ?Patient states the pain and scratchiness has improved. Getting better.  ? ?Referring physician: ?Teena Irani, PA-C ?[address] ?[place],  [place] [address] ? ?HISTORICAL INFORMATION:  ? ?Selected notes from the MEDICAL RECORD NUMBER ?Referred by ED for nail glue in eye ?LEE:  ?Ocular Hx- ?PMH- ?  ? ?CURRENT MEDICATIONS: ?No current outpatient medications on file. (Ophthalmic Drugs)  ? ?No current facility-administered medications for this visit. (Ophthalmic Drugs)  ? ?Current Outpatient Medications (Other)  ?Medication Sig  ? atomoxetine (STRATTERA) 10 MG capsule Take 10 mg by mouth daily.  ? citalopram (CELEXA) 10 MG tablet Take 10 mg by mouth daily.  ? clonazePAM (KLONOPIN) 0.5 MG tablet Take 0.5 mg by mouth daily. As needed  ? levonorgestrel (MIRENA) 20 MCG/24HR IUD 1 each by Intrauterine route once.  ? naproxen (NAPROSYN) 500 MG tablet Take 1 tablet (500 mg total) by mouth 2 (two) times daily.  ? oxyCODONE-acetaminophen (PERCOCET/ROXICET) 5-325 MG tablet Take 1 tablet by mouth every 6 (six) hours as needed for severe pain.  ? ?No current facility-administered medications for this visit. (Other)  ? ?REVIEW OF SYSTEMS: ? ? ?ALLERGIES ?No Known Allergies ? ?PAST MEDICAL HISTORY ?Past Medical History:  ?Diagnosis Date  ? Bipolar 1 disorder ([facility])   ? Chronic kidney disease   ? Frequent kidney stones; stent placed 2012  ? Eclampsia during pregnancy   ? Seizures ([facility]) 10-21-2012  ? x 2 ; No prior history  ? ?Past Surgical History:  ?Procedure Laterality Date  ? APPENDECTOMY    ? CESAREAN SECTION  10/22/2012  ? Procedure: CESAREAN SECTION;  Surgeon: Levi Aland, MD;  Location: [facility];  Service: Obstetrics;  Laterality: N/A;  ? CESAREAN SECTION  07/27/2015  ? CYSTOSCOPY  WITH STENT PLACEMENT  2012  ? ? ?FAMILY HISTORY ?Family History  ?Problem Relation Age of Onset  ? Hyperlipidemia Mother   ? Coronary artery disease Mother 49  ?     S/P CABG  ? Other Neg Hx   ? ?SOCIAL HISTORY ?Social History  ? ?Tobacco Use  ? Smoking status: Former  ?  Types: Cigarettes  ? Smokeless tobacco: Never  ?Vaping Use  ? Vaping Use: Every day  ?Substance Use Topics  ? Alcohol use: Yes  ?  Comment: occasionally  ? Drug use: No  ?  ? ?  ?OPHTHALMIC EXAM: ? ?Not recorded ?  ? ?IMAGING AND PROCEDURES  ?Imaging and Procedures for 02/22/2022 ? ? ? ?  ?  ? ?  ?ASSESSMENT/PLAN: ? ?  ICD-10-CM   ?1. Chemical injury to cornea of left eye, subsequent encounter  T26.62XD   ?  ?2. 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  ? ? ? ?1,2. Chemical injury w/ corneal epithelial defect OS ?            - pt put nail glue in her eye Saturday, 03.18.23 ?            - pt presented to ED immediately following incident ?            - was started on Oflaxacin TID and Erythromycin ung TID OS -- increased to QID 3.20.23 ?            -  pieces of glue abutting cornea were removed at the slit lamp using jeweler's forceps without complication on 3.20.23 ?            - pt reports eye pain, FBS OS, tearing -- improved today ?            - BCVA improved to 20/40 from 20/100 OS ?            - +corneal epi defect smaller -- now 72mmVx2mmH from 4.0vx5.0H inferiorly ?            - cont ofloxacin gtts and erythromycin ung to QID OS ?            - f/u 1 wk, sooner prn ? ? Ophthalmic Meds Ordered this visit:  ?No orders of the defined types were placed in this encounter. ?  ? ?No follow-ups on file. ? ?There are no Patient Instructions on file for this visit. ? ? ?Explained the diagnoses, plan, and follow up with the patient and they expressed understanding.  Patient expressed understanding of the importance of proper follow up care.  ? ?This document serves as a record of services personally performed by Karie Chimera, MD, PhD. It was created on  their behalf by Annalee Genta, COMT. The creation of this record is the provider's dictation and/or activities during the visit. ? ?Electronically signed by: Annalee Genta, COMT 02/17/22 10:51 AM ? ? ? ?Karie Chimera, M.D., Ph.D. ?Diseases & Surgery of the Retina and Vitreous ?[facility] ? ? ? ?Abbreviations: ?M myopia (nearsighted); A astigmatism; H hyperopia (farsighted); P presbyopia; Mrx spectacle prescription;  CTL contact lenses; OD right eye; OS left eye; OU both eyes  XT exotropia; ET esotropia; PEK punctate epithelial keratitis; PEE punctate epithelial erosions; DES dry eye syndrome; MGD meibomian gland dysfunction; ATs artificial tears; PFAT's preservative free artificial tears; NSC nuclear sclerotic cataract; PSC posterior subcapsular cataract; ERM epi-retinal membrane; PVD posterior vitreous detachment; RD retinal detachment; DM diabetes mellitus; DR diabetic retinopathy; NPDR non-proliferative diabetic retinopathy; PDR proliferative diabetic retinopathy; CSME clinically significant macular edema; DME diabetic macular edema; dbh dot blot hemorrhages; CWS cotton wool spot; POAG primary open angle glaucoma; C/D cup-to-disc ratio; HVF humphrey visual field; GVF goldmann visual field; OCT optical coherence tomography; IOP intraocular pressure; BRVO Branch retinal vein occlusion; CRVO central retinal vein occlusion; CRAO central retinal artery occlusion; BRAO branch retinal artery occlusion; RT retinal tear; SB scleral buckle; PPV pars plana vitrectomy; VH Vitreous hemorrhage; PRP panretinal laser photocoagulation; IVK intravitreal kenalog; VMT vitreomacular traction; MH Macular hole;  NVD neovascularization of the disc; NVE neovascularization elsewhere; AREDS age related eye disease study; ARMD age related macular degeneration; POAG primary open angle glaucoma; EBMD epithelial/anterior basement membrane dystrophy; ACIOL anterior chamber intraocular lens; IOL intraocular lens; PCIOL  posterior chamber intraocular lens; Phaco/IOL phacoemulsification with intraocular lens placement; PRK photorefractive keratectomy; LASIK laser assisted in situ keratomileusis; HTN hypertension; DM diabetes mellitus; COPD chronic obstructive pulmonary disease ? ?

## 2022-02-22 ENCOUNTER — Encounter (INDEPENDENT_AMBULATORY_CARE_PROVIDER_SITE_OTHER): Payer: 59 | Admitting: Ophthalmology

## 2022-02-22 DIAGNOSIS — T2662XD Corrosion of cornea and conjunctival sac, left eye, subsequent encounter: Secondary | ICD-10-CM

## 2022-02-22 DIAGNOSIS — H1830 Unspecified corneal membrane change: Secondary | ICD-10-CM

## 2022-08-11 ENCOUNTER — Other Ambulatory Visit: Payer: Self-pay

## 2022-08-11 ENCOUNTER — Emergency Department (HOSPITAL_BASED_OUTPATIENT_CLINIC_OR_DEPARTMENT_OTHER)
Admission: EM | Admit: 2022-08-11 | Discharge: 2022-08-11 | Disposition: A | Discharge status: Home / Self Care | Payer: BC Managed Care – PPO | Attending: Emergency Medicine | Admitting: Emergency Medicine

## 2022-08-11 ENCOUNTER — Encounter (HOSPITAL_BASED_OUTPATIENT_CLINIC_OR_DEPARTMENT_OTHER): Payer: Self-pay | Admitting: Emergency Medicine

## 2022-08-11 ENCOUNTER — Emergency Department (HOSPITAL_BASED_OUTPATIENT_CLINIC_OR_DEPARTMENT_OTHER): Payer: BC Managed Care – PPO

## 2022-08-11 DIAGNOSIS — N132 Hydronephrosis with renal and ureteral calculous obstruction: Secondary | ICD-10-CM | POA: Diagnosis not present

## 2022-08-11 DIAGNOSIS — R1084 Generalized abdominal pain: Secondary | ICD-10-CM | POA: Diagnosis not present

## 2022-08-11 DIAGNOSIS — N200 Calculus of kidney: Secondary | ICD-10-CM | POA: Diagnosis not present

## 2022-08-11 DIAGNOSIS — R1032 Left lower quadrant pain: Secondary | ICD-10-CM | POA: Diagnosis not present

## 2022-08-11 DIAGNOSIS — N201 Calculus of ureter: Secondary | ICD-10-CM | POA: Diagnosis not present

## 2022-08-11 LAB — CBC
HCT: 41.3 % (ref 36.0–46.0)
Hemoglobin: 14.2 g/dL (ref 12.0–15.0)
MCH: 30 pg (ref 26.0–34.0)
MCHC: 34.4 g/dL (ref 30.0–36.0)
MCV: 87.3 fL (ref 80.0–100.0)
Platelets: 265 10*3/uL (ref 150–400)
RBC: 4.73 MIL/uL (ref 3.87–5.11)
RDW: 12.3 % (ref 11.5–15.5)
WBC: 10.5 10*3/uL (ref 4.0–10.5)
nRBC: 0 % (ref 0.0–0.2)

## 2022-08-11 LAB — COMPREHENSIVE METABOLIC PANEL
ALT: 12 U/L (ref 0–44)
AST: 19 U/L (ref 15–41)
Albumin: 4.5 g/dL (ref 3.5–5.0)
Alkaline Phosphatase: 53 U/L (ref 38–126)
Anion gap: 6 (ref 5–15)
BUN: 15 mg/dL (ref 6–20)
CO2: 25 mmol/L (ref 22–32)
Calcium: 9.1 mg/dL (ref 8.9–10.3)
Chloride: 106 mmol/L (ref 98–111)
Creatinine, Ser: 0.89 mg/dL (ref 0.44–1.00)
GFR, Estimated: >60 mL/min (ref >60)
Glucose, Bld: 91 mg/dL (ref 70–99)
Potassium: 3.5 mmol/L (ref 3.5–5.1)
Sodium: 137 mmol/L (ref 135–145)
Total Bilirubin: 1 mg/dL (ref 0.3–1.2)
Total Protein: 8 g/dL (ref 6.5–8.1)

## 2022-08-11 LAB — URINALYSIS, ROUTINE W REFLEX MICROSCOPIC
Glucose, UA: NEGATIVE mg/dL
Ketones, ur: 80 mg/dL — AB
Leukocytes,Ua: NEGATIVE
Nitrite: NEGATIVE
Protein, ur: 30 mg/dL — AB
Specific Gravity, Urine: >=1.03 (ref 1.005–1.030)
pH: 6 (ref 5.0–8.0)

## 2022-08-11 LAB — LIPASE, BLOOD: Lipase: 29 U/L (ref 11–51)

## 2022-08-11 LAB — URINALYSIS, MICROSCOPIC (REFLEX): RBC / HPF: >50 RBC/hpf (ref 0–5)

## 2022-08-11 LAB — PREGNANCY, URINE: Preg Test, Ur: NEGATIVE

## 2022-08-11 MED ORDER — HYDROCODONE-ACETAMINOPHEN 5-325 MG PO TABS: 1.0000 | ORAL_TABLET | Freq: Four times a day (QID) | ORAL | 0 refills | Status: DC | PRN

## 2022-08-11 MED ORDER — NAPROXEN 375 MG PO TABS: 375.0000 mg | ORAL_TABLET | Freq: Two times a day (BID) | ORAL | 0 refills | Status: DC

## 2022-08-11 MED ORDER — ONDANSETRON 8 MG PO TBDP: 8.0000 mg | ORAL_TABLET | Freq: Three times a day (TID) | ORAL | 0 refills | Status: DC | PRN

## 2022-08-11 MED ORDER — KETOROLAC TROMETHAMINE 15 MG/ML IJ SOLN
15.0000 mg | Freq: Once | INTRAMUSCULAR | Status: AC
  Administered 2022-08-11: 15 mg via INTRAVENOUS
  Filled 2022-08-11: qty 1

## 2022-08-11 NOTE — ED Provider Notes (Signed)
[facility] DEPART[facility] Provider Note   CSN: [id number] Arrival date & time: 08/11/22  1443     History  Chief Complaint  Patient presents with   Abdominal Pain    LLQ    Olivia [name] is a 26 y.o. female.   Abdominal Pain    Patient presents the emergency room for evaluation of left lower abdominal pain.  Patient states she has a history of kidney stones as well as ovarian cysts.  She was at home this morning when she had sudden onset of lower abdominal pain.  She went to the bathroom had a bowel movement and did not notice any improvement.  Pain increased in severity and became severe.  She became nauseous and had several episodes of vomiting.  She has not noticed any dysuria.  No vaginal discharge.  She is currently on her menses.  Patient's states the pain has subsequently now resolved.  She is a little sore but does not have any severe pain or discomfort.  Home Medications Prior to Admission medications   Medication Sig Start Date End Date Taking? Authorizing Provider  HYDROcodone-acetaminophen (NORCO/VICODIN) 5-325 MG tablet Take 1 tablet by mouth every 6 (six) hours as needed. 08/11/22  Yes Dorie Rank, MD  naproxen (NAPROSYN) 375 MG tablet Take 1 tablet (375 mg total) by mouth 2 (two) times daily. 08/11/22  Yes Dorie Rank, MD  ondansetron (ZOFRAN-ODT) 8 MG disintegrating tablet Take 1 tablet (8 mg total) by mouth every 8 (eight) hours as needed for nausea or vomiting. 08/11/22  Yes Dorie Rank, MD  atomoxetine (STRATTERA) 10 MG capsule Take 10 mg by mouth daily.    [provider]  citalopram (CELEXA) 10 MG tablet Take 10 mg by mouth daily. 12/01/20   [provider]  clonazePAM (KLONOPIN) 0.5 MG tablet Take 0.5 mg by mouth daily. As needed    [provider]  levonorgestrel (MIRENA) 20 MCG/24HR IUD 1 each by Intrauterine route once.    [provider]  oxyCODONE-acetaminophen (PERCOCET/ROXICET) 5-325 MG tablet Take 1 tablet  by mouth every 6 (six) hours as needed for severe pain. 02/06/22   Kommor, Debe Coder, MD      Allergies    Patient has no known allergies.    Review of Systems   Review of Systems  Gastrointestinal:  Positive for abdominal pain.    Physical Exam Updated Vital Signs BP 105/75   Pulse 80   Temp 98.3 F (36.8 C)   Resp 17   Ht 1.524 m (5')   Wt 47.6 kg   SpO2 99%   BMI 20.51 kg/m  Physical Exam Vitals and nursing note reviewed.  Constitutional:      General: She is not in acute distress.    Appearance: She is well-developed.  HENT:     Head: Normocephalic and atraumatic.     Right Ear: External ear normal.     Left Ear: External ear normal.  Eyes:     General: No scleral icterus.       Right eye: No discharge.        Left eye: No discharge.     Conjunctiva/sclera: Conjunctivae normal.  Neck:     Trachea: No tracheal deviation.  Cardiovascular:     Rate and Rhythm: Normal rate and regular rhythm.  Pulmonary:     Effort: Pulmonary effort is normal. No respiratory distress.     Breath sounds: Normal breath sounds. No stridor. No wheezing or rales.  Abdominal:  General: Bowel sounds are normal. There is no distension.     Palpations: Abdomen is soft.     Tenderness: There is no abdominal tenderness. There is no left CVA tenderness, guarding or rebound.     Comments: Mild tenderness palpation left lower quadrant  Musculoskeletal:        General: No tenderness or deformity.     Cervical back: Neck supple.  Skin:    General: Skin is warm and dry.     Findings: No rash.  Neurological:     General: No focal deficit present.     Mental Status: She is alert.     Cranial Nerves: No cranial nerve deficit (no facial droop, extraocular movements intact, no slurred speech).     Sensory: No sensory deficit.     Motor: No abnormal muscle tone or seizure activity.     Coordination: Coordination normal.  Psychiatric:        Mood and Affect: Mood normal.     ED Results /  Procedures / Treatments   Labs (all labs ordered are listed, but only abnormal results are displayed) Labs Reviewed  URINALYSIS, ROUTINE W REFLEX MICROSCOPIC - Abnormal; Notable for the following components:      Result Value   Color, Urine AMBER (*)    APPearance CLOUDY (*)    Hgb urine dipstick LARGE (*)    Bilirubin Urine SMALL (*)    Ketones, ur 80 (*)    Protein, ur 30 (*)    All other components within normal limits  URINALYSIS, MICROSCOPIC (REFLEX) - Abnormal; Notable for the following components:   Bacteria, UA FEW (*)    All other components within normal limits  LIPASE, BLOOD  COMPREHENSIVE METABOLIC PANEL  CBC  PREGNANCY, URINE    EKG None  Radiology CT Renal Stone Study  Result Date: 08/11/2022 CLINICAL DATA:  Left lower quadrant pain radiating to the flank. History of kidney stones. EXAM: CT ABDOMEN AND PELVIS WITHOUT CONTRAST TECHNIQUE: Multidetector CT imaging of the abdomen and pelvis was performed following the standard protocol without IV contrast. RADIATION DOSE REDUCTION: This exam was performed according to the departmental dose-optimization program which includes automated exposure control, adjustment of the mA and/or kV according to patient size and/or use of iterative reconstruction technique. COMPARISON:  08/05/2018 FINDINGS: Lower chest: Unremarkable. Hepatobiliary: No suspicious focal abnormality in the liver on this study without intravenous contrast. There is no evidence for gallstones, gallbladder wall thickening, or pericholecystic fluid. No intrahepatic or extrahepatic biliary dilation. Pancreas: No focal mass lesion. No dilatation of the main duct. No intraparenchymal cyst. No peripancreatic edema. Spleen: No splenomegaly. No focal mass lesion. Adrenals/Urinary Tract: No adrenal nodule or mass. Punctate nonobstructing stone seen upper pole right kidney on 23/2. No right ureteral stone. No secondary changes in the right kidney or ureter. There is mild  fullness of the left intrarenal collecting system and proximal ureter secondary to the presence of a 1-2 mm stone in the proximal left ureter (axial image 31/2 and best seen on coronal 33/5). Left ureter otherwise unremarkable. The urinary bladder appears normal for the degree of distention. Stomach/Bowel: Stomach is unremarkable. No gastric wall thickening. No evidence of outlet obstruction. Duodenum is normally positioned as is the ligament of Treitz. No small bowel wall thickening. No small bowel dilatation. The terminal ileum is normal. Nonvisualization of the appendix is consistent with the reported history of appendectomy. No gross colonic mass. No colonic wall thickening. Vascular/Lymphatic: No abdominal aortic aneurysm. No abdominal aortic  atherosclerotic calcification. There is no gastrohepatic or hepatoduodenal ligament lymphadenopathy. No retroperitoneal or mesenteric lymphadenopathy. No pelvic sidewall lymphadenopathy. Reproductive: IUD is visualized in the uterus. There is no adnexal mass. Other: No intraperitoneal free fluid. Musculoskeletal: No worrisome lytic or sclerotic osseous abnormality. IMPRESSION: 1. 1-2 mm proximal left ureteral stone with mild fullness of the left intrarenal collecting system and proximal ureter. 2. Punctate nonobstructing stone upper pole right kidney. Electronically Signed   By: Kennith Center M.D.   On: 08/11/2022 09:22    Procedures Procedures    Medications Ordered in ED Medications  ketorolac (TORADOL) 15 MG/ML injection 15 mg (15 mg Intravenous Given 08/11/22 0944)    ED Course/ Medical Decision Making/ A&P Clinical Course as of 08/11/22 0946  Wed Aug 11, 2022  0910 Urinalysis, Microscopic (reflex)(!) Large amount of blood in the urine [JK]  0911 Comprehensive metabolic panel Normal [JK]  0911 CBC Normal [JK]  0911 Lipase, blood Normal [JK]  0911 Pregnancy, urine Negative pregnancy test [JK]  0933 CT scan reviewed1. 1-2 mm proximal left ureteral  stone with mild fullness of the left intrarenal collecting system and proximal ureter.   [JK]    Clinical Course User Index [JK] Linwood Dibbles, MD                           Medical Decision Making Differential diagnosis includes but not limited to ectopic pregnancy, ovarian cyst, diverticulitis, ureteral stone  Problems Addressed: Ureteral stone with hydronephrosis: acute illness or injury that poses a threat to life or bodily functions  Amount and/or Complexity of Data Reviewed Labs: ordered. Decision-making details documented in ED Course. Radiology: ordered and independent interpretation performed.  Risk Prescription drug management.   Patient presented to the ED for evaluation of acute abdominal pain.  Laboratory test notable for hematuria.  Patient however is on her menses.  With her severe and intense flank pain CT scan was ordered.  It does show evidence of a ureteral stone with hydronephrosis.  Patient has mild pain at this time.  Given a dose of Toradol for relief.  Will discharge home with medications for pain and discomfort.  Outpatient follow-up with urology.        Final Clinical Impression(s) / ED Diagnoses Final diagnoses:  Ureteral stone with hydronephrosis    Rx / DC Orders ED Discharge Orders          Ordered    naproxen (NAPROSYN) 375 MG tablet  2 times daily        08/11/22 0943    HYDROcodone-acetaminophen (NORCO/VICODIN) 5-325 MG tablet  Every 6 hours PRN        08/11/22 0943    ondansetron (ZOFRAN-ODT) 8 MG disintegrating tablet  Every 8 hours PRN        08/11/22 0943              Linwood Dibbles, MD 08/11/22 [phone]

## 2022-08-11 NOTE — ED Triage Notes (Signed)
Brought in by EMS, LLQ pain radiating to flank, Hx kidney stones , nausea . No urinary symptoms per EMS.

## 2022-08-11 NOTE — Discharge Instructions (Addendum)
Take the medications as needed for pain.  The stone should hopefully pass on its own.  Follow-up with the urologist if the symptoms are persisting.  Return to the ER for severe pain fever, worsening symptoms

## 2022-08-18 DIAGNOSIS — F331 Major depressive disorder, recurrent, moderate: Secondary | ICD-10-CM | POA: Diagnosis not present

## 2022-08-18 DIAGNOSIS — F411 Generalized anxiety disorder: Secondary | ICD-10-CM | POA: Diagnosis not present

## 2022-09-02 DIAGNOSIS — F411 Generalized anxiety disorder: Secondary | ICD-10-CM | POA: Diagnosis not present

## 2022-09-02 DIAGNOSIS — F331 Major depressive disorder, recurrent, moderate: Secondary | ICD-10-CM | POA: Diagnosis not present

## 2023-03-28 DIAGNOSIS — J020 Streptococcal pharyngitis: Secondary | ICD-10-CM | POA: Diagnosis not present

## 2023-03-28 DIAGNOSIS — J029 Acute pharyngitis, unspecified: Secondary | ICD-10-CM | POA: Diagnosis not present

## 2023-04-25 DIAGNOSIS — F900 Attention-deficit hyperactivity disorder, predominantly inattentive type: Secondary | ICD-10-CM | POA: Diagnosis not present

## 2023-04-25 DIAGNOSIS — F331 Major depressive disorder, recurrent, moderate: Secondary | ICD-10-CM | POA: Diagnosis not present

## 2023-04-25 DIAGNOSIS — F411 Generalized anxiety disorder: Secondary | ICD-10-CM | POA: Diagnosis not present

## 2023-04-26 DIAGNOSIS — F331 Major depressive disorder, recurrent, moderate: Secondary | ICD-10-CM | POA: Diagnosis not present

## 2023-04-26 DIAGNOSIS — F900 Attention-deficit hyperactivity disorder, predominantly inattentive type: Secondary | ICD-10-CM | POA: Diagnosis not present

## 2023-04-26 DIAGNOSIS — F411 Generalized anxiety disorder: Secondary | ICD-10-CM | POA: Diagnosis not present

## 2023-06-01 DIAGNOSIS — F900 Attention-deficit hyperactivity disorder, predominantly inattentive type: Secondary | ICD-10-CM | POA: Diagnosis not present

## 2023-06-01 DIAGNOSIS — F411 Generalized anxiety disorder: Secondary | ICD-10-CM | POA: Diagnosis not present

## 2023-06-01 DIAGNOSIS — F331 Major depressive disorder, recurrent, moderate: Secondary | ICD-10-CM | POA: Diagnosis not present

## 2023-06-26 ENCOUNTER — Emergency Department (HOSPITAL_BASED_OUTPATIENT_CLINIC_OR_DEPARTMENT_OTHER)
Admission: EM | Admit: 2023-06-26 | Discharge: 2023-06-26 | Disposition: A | Discharge status: Home / Self Care | Payer: BC Managed Care – PPO | Attending: Emergency Medicine | Admitting: Emergency Medicine

## 2023-06-26 ENCOUNTER — Emergency Department (HOSPITAL_BASED_OUTPATIENT_CLINIC_OR_DEPARTMENT_OTHER): Payer: BC Managed Care – PPO

## 2023-06-26 ENCOUNTER — Encounter (HOSPITAL_BASED_OUTPATIENT_CLINIC_OR_DEPARTMENT_OTHER): Payer: Self-pay | Admitting: Emergency Medicine

## 2023-06-26 ENCOUNTER — Other Ambulatory Visit: Payer: Self-pay

## 2023-06-26 DIAGNOSIS — S6721XA Crushing injury of right hand, initial encounter: Secondary | ICD-10-CM | POA: Diagnosis not present

## 2023-06-26 DIAGNOSIS — S60041A Contusion of right ring finger without damage to nail, initial encounter: Secondary | ICD-10-CM | POA: Insufficient documentation

## 2023-06-26 DIAGNOSIS — S60021A Contusion of right index finger without damage to nail, initial encounter: Secondary | ICD-10-CM | POA: Insufficient documentation

## 2023-06-26 DIAGNOSIS — S60031A Contusion of right middle finger without damage to nail, initial encounter: Secondary | ICD-10-CM | POA: Diagnosis not present

## 2023-06-26 DIAGNOSIS — Y92810 Car as the place of occurrence of the external cause: Secondary | ICD-10-CM | POA: Diagnosis not present

## 2023-06-26 DIAGNOSIS — S60221A Contusion of right hand, initial encounter: Secondary | ICD-10-CM

## 2023-06-26 DIAGNOSIS — W231XXA Caught, crushed, jammed, or pinched between stationary objects, initial encounter: Secondary | ICD-10-CM | POA: Diagnosis not present

## 2023-06-26 DIAGNOSIS — M79641 Pain in right hand: Secondary | ICD-10-CM | POA: Diagnosis not present

## 2023-06-26 NOTE — ED Notes (Signed)
Discharge instructions reviewed with patient. Patient questions answered and opportunity for education reviewed. Patient voices understanding of discharge instructions with no further questions. Patient ambulatory with steady gait to lobby.  

## 2023-06-26 NOTE — Discharge Instructions (Signed)
Recommend ice, Tylenol, ibuprofen

## 2023-06-26 NOTE — ED Triage Notes (Signed)
Right hand was "slammed" in a car door yesterday. Concerned for fracture of 2nd, 3rd and 4th digit on right hand.

## 2023-06-26 NOTE — ED Provider Notes (Signed)
[place] [facility] Provider Note   CSN: [id number] Arrival date & time: 06/26/23  1529     History  Chief Complaint  Patient presents with   Hand Pain    Olivia [name] is a 27 y.o. female.  Patient here with right hand pain after door slammed on her hand.  Pain in the second third and fourth fingers.  Nothing makes it worse or better.  History of bipolar.  Not on any blood thinners.  Denies any other injury elsewhere.  The history is provided by the patient.       Home Medications Prior to Admission medications   Medication Sig Start Date End Date Taking? Authorizing Provider  atomoxetine (STRATTERA) 10 MG capsule Take 10 mg by mouth daily.    [provider]  citalopram (CELEXA) 10 MG tablet Take 10 mg by mouth daily. 12/01/20   [provider]  clonazePAM (KLONOPIN) 0.5 MG tablet Take 0.5 mg by mouth daily. As needed    [provider]  HYDROcodone-acetaminophen (NORCO/VICODIN) 5-325 MG tablet Take 1 tablet by mouth every 6 (six) hours as needed. 08/11/22   Linwood Dibbles, MD  levonorgestrel (MIRENA) 20 MCG/24HR IUD 1 each by Intrauterine route once.    [provider]  naproxen (NAPROSYN) 375 MG tablet Take 1 tablet (375 mg total) by mouth 2 (two) times daily. 08/11/22   Linwood Dibbles, MD  ondansetron (ZOFRAN-ODT) 8 MG disintegrating tablet Take 1 tablet (8 mg total) by mouth every 8 (eight) hours as needed for nausea or vomiting. 08/11/22   Linwood Dibbles, MD  oxyCODONE-acetaminophen (PERCOCET/ROXICET) 5-325 MG tablet Take 1 tablet by mouth every 6 (six) hours as needed for severe pain. 02/06/22   Kommor, Wyn Forster, MD      Allergies    Patient has no known allergies.    Review of Systems   Review of Systems  Physical Exam Updated Vital Signs BP 120/85 (BP Location: Left Arm)   Pulse 96   Temp 98 F (36.7 C) (Oral)   Resp 16   Ht 5' (1.524 m)   Wt 49.9 kg   SpO2 100%   BMI 21.48 kg/m  Physical  Exam Constitutional:      General: She is not in acute distress.    Appearance: She is not ill-appearing.  Cardiovascular:     Pulses: Normal pulses.  Musculoskeletal:        General: Tenderness present. No deformity. Normal range of motion.     Comments: Tenderness to the right hand mostly over the second third and fourth digits, patient is able to flex and extend all digits without any obvious laxity or deformity  Skin:    General: Skin is warm.     Capillary Refill: Capillary refill takes less than 2 seconds.     Findings: Bruising present.     Comments: Bruising to the back of the second third and fourth fingers  Neurological:     General: No focal deficit present.     Mental Status: She is alert.     Sensory: No sensory deficit.     Motor: No weakness.     ED Results / Procedures / Treatments   Labs (all labs ordered are listed, but only abnormal results are displayed) Labs Reviewed - No data to display  EKG None  Radiology DG Hand Complete Right  Result Date: 06/26/2023 CLINICAL DATA:  Right hand injury. Hand was slammed in a car door yesterday. Pain. EXAM: RIGHT  HAND - COMPLETE 3+ VIEW COMPARISON:  None Available. FINDINGS: There is no evidence of fracture or dislocation. There is no evidence of arthropathy or other focal bone abnormality. Soft tissues are unremarkable. IMPRESSION: No fracture or dislocation of the right hand. Electronically Signed   By: Narda Rutherford M.D.   On: 06/26/2023 16:08    Procedures Procedures    Medications Ordered in ED Medications - No data to display  ED Course/ Medical Decision Making/ A&P                                 Medical Decision Making Amount and/or Complexity of Data Reviewed Radiology: ordered.   Olivia [name] is here with right hand pain after door slammed on it yesterday.  Normal vitals.  No fever.  Neurovascular neuromuscular intact.  Some bruising to the back of the right second third and fourth fingers.  She  is able to flex and extend the digits with some discomfort but does not appear to be any tendon or ligament injury.  Will get x-ray to evaluate for fracture versus contusion.  She has been taking OTCs with good improvement.  X-ray per radiology report negative for fracture.  Overall suspect contusion.  Recommend Tylenol, ice, ibuprofen.  Discharged in good condition.  This chart was dictated using voice recognition software.  Despite best efforts to proofread,  errors can occur which can change the documentation meaning.         Final Clinical Impression(s) / ED Diagnoses Final diagnoses:  Contusion of right hand, initial encounter    Rx / DC Orders ED Discharge Orders     None         Virgina Norfolk, DO 06/26/23 1619

## 2023-07-20 DIAGNOSIS — F331 Major depressive disorder, recurrent, moderate: Secondary | ICD-10-CM | POA: Diagnosis not present

## 2023-07-20 DIAGNOSIS — F900 Attention-deficit hyperactivity disorder, predominantly inattentive type: Secondary | ICD-10-CM | POA: Diagnosis not present

## 2023-07-20 DIAGNOSIS — F411 Generalized anxiety disorder: Secondary | ICD-10-CM | POA: Diagnosis not present

## 2023-08-08 DIAGNOSIS — F331 Major depressive disorder, recurrent, moderate: Secondary | ICD-10-CM | POA: Diagnosis not present

## 2023-08-08 DIAGNOSIS — F411 Generalized anxiety disorder: Secondary | ICD-10-CM | POA: Diagnosis not present

## 2023-08-08 DIAGNOSIS — F900 Attention-deficit hyperactivity disorder, predominantly inattentive type: Secondary | ICD-10-CM | POA: Diagnosis not present

## 2023-09-16 DIAGNOSIS — F900 Attention-deficit hyperactivity disorder, predominantly inattentive type: Secondary | ICD-10-CM | POA: Diagnosis not present

## 2023-09-16 DIAGNOSIS — F331 Major depressive disorder, recurrent, moderate: Secondary | ICD-10-CM | POA: Diagnosis not present

## 2023-09-16 DIAGNOSIS — F411 Generalized anxiety disorder: Secondary | ICD-10-CM | POA: Diagnosis not present

## 2023-10-14 DIAGNOSIS — F331 Major depressive disorder, recurrent, moderate: Secondary | ICD-10-CM | POA: Diagnosis not present

## 2023-10-14 DIAGNOSIS — F411 Generalized anxiety disorder: Secondary | ICD-10-CM | POA: Diagnosis not present

## 2023-10-14 DIAGNOSIS — F900 Attention-deficit hyperactivity disorder, predominantly inattentive type: Secondary | ICD-10-CM | POA: Diagnosis not present

## 2023-11-08 DIAGNOSIS — F411 Generalized anxiety disorder: Secondary | ICD-10-CM | POA: Diagnosis not present

## 2023-11-08 DIAGNOSIS — F331 Major depressive disorder, recurrent, moderate: Secondary | ICD-10-CM | POA: Diagnosis not present

## 2023-11-08 DIAGNOSIS — F900 Attention-deficit hyperactivity disorder, predominantly inattentive type: Secondary | ICD-10-CM | POA: Diagnosis not present

## 2023-11-09 DIAGNOSIS — F900 Attention-deficit hyperactivity disorder, predominantly inattentive type: Secondary | ICD-10-CM | POA: Diagnosis not present

## 2023-11-09 DIAGNOSIS — F411 Generalized anxiety disorder: Secondary | ICD-10-CM | POA: Diagnosis not present

## 2023-11-09 DIAGNOSIS — F331 Major depressive disorder, recurrent, moderate: Secondary | ICD-10-CM | POA: Diagnosis not present

## 2023-11-15 DIAGNOSIS — F900 Attention-deficit hyperactivity disorder, predominantly inattentive type: Secondary | ICD-10-CM | POA: Diagnosis not present

## 2023-11-15 DIAGNOSIS — F411 Generalized anxiety disorder: Secondary | ICD-10-CM | POA: Diagnosis not present

## 2023-11-15 DIAGNOSIS — F331 Major depressive disorder, recurrent, moderate: Secondary | ICD-10-CM | POA: Diagnosis not present

## 2023-12-02 DIAGNOSIS — F900 Attention-deficit hyperactivity disorder, predominantly inattentive type: Secondary | ICD-10-CM | POA: Diagnosis not present

## 2023-12-02 DIAGNOSIS — F411 Generalized anxiety disorder: Secondary | ICD-10-CM | POA: Diagnosis not present

## 2023-12-02 DIAGNOSIS — F331 Major depressive disorder, recurrent, moderate: Secondary | ICD-10-CM | POA: Diagnosis not present

## 2023-12-09 DIAGNOSIS — F331 Major depressive disorder, recurrent, moderate: Secondary | ICD-10-CM | POA: Diagnosis not present

## 2023-12-09 DIAGNOSIS — F900 Attention-deficit hyperactivity disorder, predominantly inattentive type: Secondary | ICD-10-CM | POA: Diagnosis not present

## 2023-12-09 DIAGNOSIS — F411 Generalized anxiety disorder: Secondary | ICD-10-CM | POA: Diagnosis not present

## 2023-12-12 DIAGNOSIS — F331 Major depressive disorder, recurrent, moderate: Secondary | ICD-10-CM | POA: Diagnosis not present

## 2023-12-12 DIAGNOSIS — F411 Generalized anxiety disorder: Secondary | ICD-10-CM | POA: Diagnosis not present

## 2023-12-12 DIAGNOSIS — F900 Attention-deficit hyperactivity disorder, predominantly inattentive type: Secondary | ICD-10-CM | POA: Diagnosis not present

## 2023-12-16 DIAGNOSIS — F331 Major depressive disorder, recurrent, moderate: Secondary | ICD-10-CM | POA: Diagnosis not present

## 2023-12-16 DIAGNOSIS — F900 Attention-deficit hyperactivity disorder, predominantly inattentive type: Secondary | ICD-10-CM | POA: Diagnosis not present

## 2023-12-16 DIAGNOSIS — F411 Generalized anxiety disorder: Secondary | ICD-10-CM | POA: Diagnosis not present

## 2023-12-19 DIAGNOSIS — F331 Major depressive disorder, recurrent, moderate: Secondary | ICD-10-CM | POA: Diagnosis not present

## 2023-12-19 DIAGNOSIS — F900 Attention-deficit hyperactivity disorder, predominantly inattentive type: Secondary | ICD-10-CM | POA: Diagnosis not present

## 2023-12-19 DIAGNOSIS — F411 Generalized anxiety disorder: Secondary | ICD-10-CM | POA: Diagnosis not present

## 2024-01-02 DIAGNOSIS — F331 Major depressive disorder, recurrent, moderate: Secondary | ICD-10-CM | POA: Diagnosis not present

## 2024-01-02 DIAGNOSIS — F900 Attention-deficit hyperactivity disorder, predominantly inattentive type: Secondary | ICD-10-CM | POA: Diagnosis not present

## 2024-01-02 DIAGNOSIS — F411 Generalized anxiety disorder: Secondary | ICD-10-CM | POA: Diagnosis not present

## 2024-01-10 ENCOUNTER — Emergency Department (HOSPITAL_BASED_OUTPATIENT_CLINIC_OR_DEPARTMENT_OTHER)
Admission: EM | Admit: 2024-01-10 | Discharge: 2024-01-10 | Disposition: A | Discharge status: Home / Self Care | Payer: BC Managed Care – PPO | Attending: Emergency Medicine | Admitting: Emergency Medicine

## 2024-01-10 ENCOUNTER — Encounter (HOSPITAL_BASED_OUTPATIENT_CLINIC_OR_DEPARTMENT_OTHER): Payer: Self-pay

## 2024-01-10 ENCOUNTER — Other Ambulatory Visit: Payer: Self-pay

## 2024-01-10 DIAGNOSIS — W268XXA Contact with other sharp object(s), not elsewhere classified, initial encounter: Secondary | ICD-10-CM | POA: Diagnosis not present

## 2024-01-10 DIAGNOSIS — Z23 Encounter for immunization: Secondary | ICD-10-CM | POA: Insufficient documentation

## 2024-01-10 DIAGNOSIS — S91312A Laceration without foreign body, left foot, initial encounter: Secondary | ICD-10-CM | POA: Insufficient documentation

## 2024-01-10 DIAGNOSIS — M25572 Pain in left ankle and joints of left foot: Secondary | ICD-10-CM | POA: Diagnosis not present

## 2024-01-10 MED ORDER — TETANUS-DIPHTH-ACELL PERTUSSIS 5-2.5-18.5 LF-MCG/0.5 IM SUSY
0.5000 mL | PREFILLED_SYRINGE | Freq: Once | INTRAMUSCULAR | Status: AC
  Administered 2024-01-10: 0.5 mL via INTRAMUSCULAR
  Filled 2024-01-10: qty 0.5

## 2024-01-10 MED ORDER — LIDOCAINE-EPINEPHRINE (PF) 2 %-1:200000 IJ SOLN
10.0000 mL | Freq: Once | INTRAMUSCULAR | Status: AC
  Administered 2024-01-10: 10 mL
  Filled 2024-01-10: qty 20

## 2024-01-10 NOTE — ED Triage Notes (Addendum)
 States her heel got stuck in the floor vent and pulled it up, obtained laceration to left posteior ankle over achilles. Pain with ambulation. Bleeding controlled in triage. Had blood soaked tissue that was removed and placed new gauze.

## 2024-01-10 NOTE — Discharge Instructions (Signed)
 Please read and follow all provided instructions.  Your diagnoses today include:  1. Laceration of left heel, initial encounter     Tests performed today include: Vital signs. See below for your results today.   Medications prescribed:  Please use over-the-counter NSAID medications (ibuprofen, naproxen) or Tylenol (acetaminophen) as directed on the packaging for pain -- as long as you do not have any reasons avoid these medications. Reasons to avoid NSAID medications include: weak kidneys, a history of bleeding in your stomach or gut, or uncontrolled high blood pressure or previous heart attack. Reasons to avoid Tylenol include: liver problems or ongoing alcohol use. Never take more than 4000mg  or 8 Extra strength Tylenol in a 24 hour period.     Take any prescribed medications only as directed.   Home care instructions:  Follow any educational materials and wound care instructions contained in this packet.   Keep affected area above the level of your heart when possible to minimize swelling. Wash area gently twice a day with warm soapy water. Do not apply alcohol or hydrogen peroxide. Cover the area if it draining or weeping.   Follow-up instructions: Suture Removal: Return to the Emergency Department or see your primary care care doctor in 14 days for a recheck of your wound and removal of your sutures or staples.    Return instructions:  Return to the Emergency Department if you have: Fever Worsening pain Worsening swelling of the wound Pus draining from the wound Redness of the skin that moves away from the wound, especially if it streaks away from the affected area  Any other emergent concerns  Your vital signs today were: BP 116/84 (BP Location: Left Arm)   Pulse 82   Temp 97.8 F (36.6 C) (Oral)   Resp 16   Ht 5' (1.524 m)   Wt 45.4 kg   SpO2 100%   BMI 19.53 kg/m  If your blood pressure (BP) was elevated above 135/85 this visit, please have this repeated by your  doctor within one month. --------------

## 2024-01-10 NOTE — ED Provider Notes (Signed)
 [place] [facility] Provider Note   CSN: [id number] Arrival date & time: 01/10/24  8295     History  Chief Complaint  Patient presents with   Laceration    Olivia [name] is a 28 y.o. female.  Patient presents to the emergency department for evaluation of left heel laceration.  Patient cut her heel on a floor event this morning.  She had bleeding.  No treatments prior to arrival.  She reports minimal pain at current time.  She is able to plantarflex and dorsiflex her ankle without difficulties.  Unknown last tetanus.       Home Medications Prior to Admission medications   Medication Sig Start Date End Date Taking? Authorizing Provider  atomoxetine (STRATTERA) 10 MG capsule Take 10 mg by mouth daily.    [provider]  citalopram (CELEXA) 10 MG tablet Take 10 mg by mouth daily. 12/01/20   [provider]  clonazePAM (KLONOPIN) 0.5 MG tablet Take 0.5 mg by mouth daily. As needed    [provider]  HYDROcodone-acetaminophen (NORCO/VICODIN) 5-325 MG tablet Take 1 tablet by mouth every 6 (six) hours as needed. 08/11/22   Linwood Dibbles, MD  levonorgestrel (MIRENA) 20 MCG/24HR IUD 1 each by Intrauterine route once.    [provider]  naproxen (NAPROSYN) 375 MG tablet Take 1 tablet (375 mg total) by mouth 2 (two) times daily. 08/11/22   Linwood Dibbles, MD  ondansetron (ZOFRAN-ODT) 8 MG disintegrating tablet Take 1 tablet (8 mg total) by mouth every 8 (eight) hours as needed for nausea or vomiting. 08/11/22   Linwood Dibbles, MD  oxyCODONE-acetaminophen (PERCOCET/ROXICET) 5-325 MG tablet Take 1 tablet by mouth every 6 (six) hours as needed for severe pain. 02/06/22   Kommor, Wyn Forster, MD      Allergies    Patient has no known allergies.    Review of Systems   Review of Systems  Physical Exam Updated Vital Signs BP 116/84 (BP Location: Left Arm)   Pulse 82   Temp 97.8 F (36.6 C) (Oral)   Resp 16   Ht 5' (1.524 m)    Wt 45.4 kg   SpO2 100%   BMI 19.53 kg/m  Physical Exam Vitals and nursing note reviewed.  Constitutional:      Appearance: She is well-developed.  HENT:     Head: Normocephalic and atraumatic.  Eyes:     Pupils: Pupils are equal, round, and reactive to light.  Cardiovascular:     Pulses: Normal pulses. No decreased pulses.  Musculoskeletal:        General: Tenderness present.     Cervical back: Normal range of motion and neck supple.     Comments: Negative Thompson test.  Patient is able to actively dorsiflex and plantarflex without difficulty.  No active bleeding.  Wound 1.5 cm superficial laceration, oblique, does not penetrate or involve the Achilles tendon.  Wound base clean.  Skin:    General: Skin is warm and dry.  Neurological:     Mental Status: She is alert.     Sensory: No sensory deficit.     Comments: Motor, sensation, and vascular distal to the injury is fully intact.   Psychiatric:        Mood and Affect: Mood normal.     ED Results / Procedures / Treatments   Labs (all labs ordered are listed, but only abnormal results are displayed) Labs Reviewed - No data to display  EKG None  Radiology No  results found.  Procedures .Laceration Repair  Date/Time: 01/10/2024 11:29 AM  Performed by: Renne Crigler, PA-C Authorized by: Renne Crigler, PA-C   Consent:    Consent obtained:  Verbal   Consent given by:  Patient   Risks discussed:  Infection, pain and tendon damage Universal protocol:    Patient identity confirmed:  Verbally with patient and provided demographic data Anesthesia:    Anesthesia method:  Local infiltration   Local anesthetic:  Lidocaine 2% WITH epi Laceration details:    Location:  Foot   Foot location:  L heel   Length (cm):  1.5 Pre-procedure details:    Preparation:  Patient was prepped and draped in usual sterile fashion Treatment:    Area cleansed with:  Shur-Clens   Amount of cleaning:  Extensive   Irrigation solution:   Sterile saline   Irrigation volume:  500cc   Irrigation method:  Pressure wash (bottle cap)   Debridement:  None Skin repair:    Repair method:  Sutures   Suture size:  5-0   Suture material:  Nylon   Suture technique:  Simple interrupted   Number of sutures:  5 Approximation:    Approximation:  Close Repair type:    Repair type:  Simple Post-procedure details:    Dressing:  Open (no dressing)   Procedure completion:  Tolerated well, no immediate complications     Medications Ordered in ED Medications  Tdap (BOOSTRIX) injection 0.5 mL (has no administration in time range)  lidocaine-EPINEPHrine (XYLOCAINE W/EPI) 2 %-1:200000 (PF) injection 10 mL (has no administration in time range)    ED Course/ Medical Decision Making/ A&P    Patient seen and examined. History obtained directly from patient. Work-up including labs, imaging, EKG ordered in triage, if performed, were reviewed.    Labs/EKG: None ordered  Imaging: None ordered  Medications/Fluids: Ordered: Lidocaine 2% with epinephrine, Tdap booster  Most recent vital signs reviewed and are as follows: BP 116/84 (BP Location: Left Arm)   Pulse 82   Temp 97.8 F (36.6 C) (Oral)   Resp 16   Ht 5' (1.524 m)   Wt 45.4 kg   SpO2 100%   BMI 19.53 kg/m   Initial impression: Superficial posterior ankle laceration  11:30 AM Reassessment performed. Patient appears comfortable. Exam unchanged.  Tolerated wound repair fairly well, needed additional anesthetic.  Reviewed pertinent lab work and imaging with patient at bedside. Questions answered.   Most current vital signs reviewed and are as follows: BP 116/84 (BP Location: Left Arm)   Pulse 82   Temp 97.8 F (36.6 C) (Oral)   Resp 16   Ht 5' (1.524 m)   Wt 45.4 kg   SpO2 100%   BMI 19.53 kg/m   Plan: Discharge to home.   Prescriptions written for: None  Other home care instructions discussed: Patient counseled on wound care.    ED return instructions  discussed: Patient was urged to return to the Emergency Department urgently with worsening pain, swelling, expanding erythema especially if it streaks away from the affected area, fever, or if they have any other concerns.   Follow-up instructions discussed: Patient counseled on need to return or see PCP/urgent care for suture removal in 14 days.                                 Medical Decision Making Risk Prescription drug management.   Heel laceration in the vicinity  of the Achilles tendon, but does not appear to be involving that.  Wound was oblique and superficial.  No foreign bodies noted.  Wound well irrigated.  Negative Thompson test.        Final Clinical Impression(s) / ED Diagnoses Final diagnoses:  Laceration of left heel, initial encounter    Rx / DC Orders ED Discharge Orders     None         Renne Crigler, PA-C 01/10/24 1131    Ernie Avena, MD 01/10/24 1212

## 2024-01-10 NOTE — ED Notes (Signed)
 Discharge instructions reviewed with patient. Patient verbalizes understanding, no further questions at this time. Medications and follow up information provided. No acute distress noted at time of departure.

## 2024-01-16 DIAGNOSIS — F411 Generalized anxiety disorder: Secondary | ICD-10-CM | POA: Diagnosis not present

## 2024-01-16 DIAGNOSIS — F331 Major depressive disorder, recurrent, moderate: Secondary | ICD-10-CM | POA: Diagnosis not present

## 2024-01-16 DIAGNOSIS — F900 Attention-deficit hyperactivity disorder, predominantly inattentive type: Secondary | ICD-10-CM | POA: Diagnosis not present

## 2024-01-18 DIAGNOSIS — F331 Major depressive disorder, recurrent, moderate: Secondary | ICD-10-CM | POA: Diagnosis not present

## 2024-01-18 DIAGNOSIS — F411 Generalized anxiety disorder: Secondary | ICD-10-CM | POA: Diagnosis not present

## 2024-01-18 DIAGNOSIS — F900 Attention-deficit hyperactivity disorder, predominantly inattentive type: Secondary | ICD-10-CM | POA: Diagnosis not present

## 2024-02-06 DIAGNOSIS — F900 Attention-deficit hyperactivity disorder, predominantly inattentive type: Secondary | ICD-10-CM | POA: Diagnosis not present

## 2024-02-06 DIAGNOSIS — F411 Generalized anxiety disorder: Secondary | ICD-10-CM | POA: Diagnosis not present

## 2024-02-06 DIAGNOSIS — F331 Major depressive disorder, recurrent, moderate: Secondary | ICD-10-CM | POA: Diagnosis not present

## 2024-02-07 DIAGNOSIS — Z4802 Encounter for removal of sutures: Secondary | ICD-10-CM | POA: Diagnosis not present

## 2024-03-07 DIAGNOSIS — F900 Attention-deficit hyperactivity disorder, predominantly inattentive type: Secondary | ICD-10-CM | POA: Diagnosis not present

## 2024-03-07 DIAGNOSIS — F331 Major depressive disorder, recurrent, moderate: Secondary | ICD-10-CM | POA: Diagnosis not present

## 2024-03-07 DIAGNOSIS — F411 Generalized anxiety disorder: Secondary | ICD-10-CM | POA: Diagnosis not present

## 2024-03-23 DIAGNOSIS — F900 Attention-deficit hyperactivity disorder, predominantly inattentive type: Secondary | ICD-10-CM | POA: Diagnosis not present

## 2024-03-23 DIAGNOSIS — F331 Major depressive disorder, recurrent, moderate: Secondary | ICD-10-CM | POA: Diagnosis not present

## 2024-03-23 DIAGNOSIS — F411 Generalized anxiety disorder: Secondary | ICD-10-CM | POA: Diagnosis not present

## 2024-03-27 ENCOUNTER — Emergency Department

## 2024-03-27 ENCOUNTER — Other Ambulatory Visit: Payer: Self-pay

## 2024-03-27 ENCOUNTER — Emergency Department
Admission: EM | Admit: 2024-03-27 | Discharge: 2024-03-27 | Disposition: A | Discharge status: Home / Self Care | Attending: Emergency Medicine | Admitting: Emergency Medicine

## 2024-03-27 DIAGNOSIS — D72829 Elevated white blood cell count, unspecified: Secondary | ICD-10-CM | POA: Insufficient documentation

## 2024-03-27 DIAGNOSIS — N132 Hydronephrosis with renal and ureteral calculous obstruction: Secondary | ICD-10-CM | POA: Diagnosis not present

## 2024-03-27 DIAGNOSIS — E876 Hypokalemia: Secondary | ICD-10-CM | POA: Diagnosis not present

## 2024-03-27 DIAGNOSIS — N200 Calculus of kidney: Secondary | ICD-10-CM | POA: Diagnosis not present

## 2024-03-27 DIAGNOSIS — R1032 Left lower quadrant pain: Secondary | ICD-10-CM | POA: Diagnosis not present

## 2024-03-27 DIAGNOSIS — R1084 Generalized abdominal pain: Secondary | ICD-10-CM | POA: Diagnosis not present

## 2024-03-27 LAB — CBC WITH DIFFERENTIAL/PLATELET
Abs Immature Granulocytes: 0.09 10*3/uL — ABNORMAL HIGH (ref 0.00–0.07)
Basophils Absolute: 0.1 10*3/uL (ref 0.0–0.1)
Basophils Relative: 1 %
Eosinophils Absolute: 0.1 10*3/uL (ref 0.0–0.5)
Eosinophils Relative: 1 %
HCT: 40.1 % (ref 36.0–46.0)
Hemoglobin: 13.9 g/dL (ref 12.0–15.0)
Immature Granulocytes: 1 %
Lymphocytes Relative: 18 %
Lymphs Abs: 2.7 10*3/uL (ref 0.7–4.0)
MCH: 30.6 pg (ref 26.0–34.0)
MCHC: 34.7 g/dL (ref 30.0–36.0)
MCV: 88.3 fL (ref 80.0–100.0)
Monocytes Absolute: 1 10*3/uL (ref 0.1–1.0)
Monocytes Relative: 7 %
Neutro Abs: 11 10*3/uL — ABNORMAL HIGH (ref 1.7–7.7)
Neutrophils Relative %: 72 %
Platelets: 284 10*3/uL (ref 150–400)
RBC: 4.54 MIL/uL (ref 3.87–5.11)
RDW: 11.9 % (ref 11.5–15.5)
WBC: 15 10*3/uL — ABNORMAL HIGH (ref 4.0–10.5)
nRBC: 0 % (ref 0.0–0.2)

## 2024-03-27 LAB — COMPREHENSIVE METABOLIC PANEL WITH GFR
ALT: 15 U/L (ref 0–44)
AST: 21 U/L (ref 15–41)
Albumin: 4 g/dL (ref 3.5–5.0)
Alkaline Phosphatase: 41 U/L (ref 38–126)
Anion gap: 9 (ref 5–15)
BUN: 14 mg/dL (ref 6–20)
CO2: 22 mmol/L (ref 22–32)
Calcium: 9.1 mg/dL (ref 8.9–10.3)
Chloride: 104 mmol/L (ref 98–111)
Creatinine, Ser: 0.9 mg/dL (ref 0.44–1.00)
GFR, Estimated: >60 mL/min (ref >60)
Glucose, Bld: 131 mg/dL — ABNORMAL HIGH (ref 70–99)
Potassium: 3.4 mmol/L — ABNORMAL LOW (ref 3.5–5.1)
Sodium: 135 mmol/L (ref 135–145)
Total Bilirubin: 0.8 mg/dL (ref 0.0–1.2)
Total Protein: 7.5 g/dL (ref 6.5–8.1)

## 2024-03-27 LAB — HCG, QUANTITATIVE, PREGNANCY: hCG, Beta Chain, Quant, S: <1 m[IU]/mL (ref <5)

## 2024-03-27 LAB — URINALYSIS, ROUTINE W REFLEX MICROSCOPIC
Bilirubin Urine: NEGATIVE
Glucose, UA: NEGATIVE mg/dL
Ketones, ur: 5 mg/dL — AB
Leukocytes,Ua: NEGATIVE
Nitrite: NEGATIVE
Protein, ur: 100 mg/dL — AB
RBC / HPF: >50 RBC/hpf (ref 0–5)
Specific Gravity, Urine: 1.026 (ref 1.005–1.030)
pH: 5 (ref 5.0–8.0)

## 2024-03-27 LAB — LIPASE, BLOOD: Lipase: 32 U/L (ref 11–51)

## 2024-03-27 MED ORDER — HYDROCODONE-ACETAMINOPHEN 5-325 MG PO TABS: 1.0000 | ORAL_TABLET | Freq: Four times a day (QID) | ORAL | 0 refills | Status: DC | PRN

## 2024-03-27 MED ORDER — KETOROLAC TROMETHAMINE 30 MG/ML IJ SOLN
15.0000 mg | Freq: Once | INTRAMUSCULAR | Status: AC
  Administered 2024-03-27: 15 mg via INTRAVENOUS
  Filled 2024-03-27: qty 1

## 2024-03-27 MED ORDER — ONDANSETRON HCL 4 MG/2ML IJ SOLN
4.0000 mg | Freq: Once | INTRAMUSCULAR | Status: AC
  Administered 2024-03-27: 4 mg via INTRAVENOUS
  Filled 2024-03-27: qty 2

## 2024-03-27 MED ORDER — MORPHINE SULFATE (PF) 4 MG/ML IV SOLN
4.0000 mg | Freq: Once | INTRAVENOUS | Status: AC
  Administered 2024-03-27: 4 mg via INTRAVENOUS
  Filled 2024-03-27: qty 1

## 2024-03-27 MED ORDER — CEPHALEXIN 500 MG PO CAPS: 500.0000 mg | ORAL_CAPSULE | Freq: Two times a day (BID) | ORAL | 0 refills | Status: AC

## 2024-03-27 MED ORDER — HYDROCODONE-ACETAMINOPHEN 5-325 MG PO TABS
1.0000 | ORAL_TABLET | Freq: Once | ORAL | Status: AC
  Administered 2024-03-27: 1 via ORAL
  Filled 2024-03-27: qty 1

## 2024-03-27 MED ORDER — CEPHALEXIN 500 MG PO CAPS
500.0000 mg | ORAL_CAPSULE | Freq: Once | ORAL | Status: AC
  Administered 2024-03-27: 500 mg via ORAL
  Filled 2024-03-27: qty 1

## 2024-03-27 MED ORDER — ONDANSETRON 4 MG PO TBDP
4.0000 mg | ORAL_TABLET | Freq: Once | ORAL | Status: AC
  Administered 2024-03-27: 4 mg via ORAL
  Filled 2024-03-27: qty 1

## 2024-03-27 MED ORDER — ONDANSETRON 4 MG PO TBDP: 4.0000 mg | ORAL_TABLET | Freq: Four times a day (QID) | ORAL | 0 refills | Status: DC | PRN

## 2024-03-27 NOTE — ED Triage Notes (Signed)
 Pt arrives via EMS from home with c/o LLQ abdominal pain that radiates to the left flank. Intermittent N/V. Pt reported to have vomited x 1 tonight. PMH: Kidney stones. No meds PTA.

## 2024-03-27 NOTE — ED Provider Notes (Signed)
 CT Renal Stone Study Result Date: 03/27/2024 CLINICAL DATA:  Acute left lower quadrant abdominal pain. EXAM: CT ABDOMEN AND PELVIS WITHOUT CONTRAST TECHNIQUE: Multidetector CT imaging of the abdomen and pelvis was performed following the standard protocol without IV contrast. RADIATION DOSE REDUCTION: This exam was performed according to the departmental dose-optimization program which includes automated exposure control, adjustment of the mA and/or kV according to patient size and/or use of iterative reconstruction technique. COMPARISON:  August 11, 2022. FINDINGS: Lower chest: No acute abnormality. Hepatobiliary: No focal liver abnormality is seen. No gallstones, gallbladder wall thickening, or biliary dilatation. Pancreas: Unremarkable. No pancreatic ductal dilatation or surrounding inflammatory changes. Spleen: Normal in size without focal abnormality. Adrenals/Urinary Tract: Adrenal glands appear normal. Bilateral nonobstructive nephrolithiasis is noted. Mild left hydroureteronephrosis is noted secondary to 5 mm distal left ureteral calculus. Urinary bladder is decompressed. Stomach/Bowel: Stomach is unremarkable status post appendectomy. No evidence of bowel obstruction or inflammation. Vascular/Lymphatic: No significant vascular findings are present. No enlarged abdominal or pelvic lymph nodes. Reproductive: Intrauterine device is noted. No adnexal abnormality is noted. Other: No ascites or hernia is noted. Musculoskeletal: No acute or significant osseous findings. IMPRESSION: Bilateral nephrolithiasis. Mild left hydroureteronephrosis is noted secondary to 5 mm distal left ureteral calculus. Electronically Signed   By: Rosalene Colon M.D.   On: 03/27/2024 08:18     ----------------------------------------- 9:48 AM on 03/27/2024 -----------------------------------------  Patient is currently sitting up in bed without distress reading a book.  Reports her pain much improved.  She feels much better.   No fevers no chills no infectious type symptoms.  Labs reviewed, I do not have a strong suspicion for urinary tract infection but given her leukocytosis and rare bacteria though noted some contamination of sample will start on cephalexin for prevention prophylaxis of UTI in the setting of kidney stone.  She does report passing some stones and requiring renal stenting in the distal past as well.  Based on size of stone today I expect expectant management.  She is comfortable with this plan.  Discussed careful return precautions follow-up plan with [facility] who she will call today, and treatment  I will prescribe the patient a narcotic pain medicine due to their condition which I anticipate will cause at least moderate pain short term. I discussed with the patient safe use of narcotic pain medicines, and that they are not to drive, work in dangerous areas, or ever take more than prescribed (no more than 1-2 pill every 6 hours). We discussed that this is the type of medication that can be  overdosed on and the risks of this type of medicine. Patient is very agreeable to only use as prescribed and to never use more than prescribed.  Patient's mother is here will be driving her if she is not going to be driving while using hydrocodone   Return precautions and treatment recommendations and follow-up discussed with the patient who is agreeable with the plan.   Vitals:   03/27/24 0645 03/27/24 0700  BP:  108/80  Pulse: (!) 56 71  Resp:  18  Temp:    SpO2: 100% 96%       Iver Marker, MD 03/27/24 [phone]

## 2024-03-27 NOTE — ED Provider Notes (Addendum)
 Patient currently on bed, reports severe intractable pain in the left flank area.  She appears quite uncomfortable and in pain.  Patient relates no history of medication allergies.  She does relate that the morphine  provided brief relief.  Will administer Toradol  due to the intractable nature of the pain, and redosed morphine  at this time.  Patient agreeable to proceed with CT scan once pain has improved to at least a tolerable level   Iver Marker, MD 03/27/24 1610    Iver Marker, MD 03/27/24 [phone]

## 2024-03-27 NOTE — Discharge Instructions (Addendum)
 You have been seen in the Emergency Department (ED) today for pain that we believe based on your workup, is caused by kidney stones.  As we have discussed, please drink plenty of fluids.  Please make a follow up appointment with the physician(s) listed elsewhere in this documentation.  You may take pain medication as needed but ONLY as prescribed.  This medication is an opiate (or narcotic) pain medication and can be habit forming.  Use it as little as possible to achieve adequate pain control.  Do not use or use it with extreme caution if you have a history of opiate abuse or dependence.  If you are on a pain contract with your primary care doctor or a pain specialist, be sure to let them know you were prescribed this medication today from the [facility] Emergency Department.  This medication is intended for your use only - do not give any to anyone else and keep it in a secure place where nobody else, especially children, have access to it.  It will also cause or worsen constipation, so you may want to consider taking an over-the-counter stool softener while you are taking this medication.  Return to the Emergency Department (ED) or call your doctor if you have any worsening pain, fever, painful urination, are unable to urinate, or develop other symptoms that concern you.

## 2024-03-27 NOTE — ED Provider Notes (Signed)
 [facility] Provider Note    Event Date/Time   First MD Initiated Contact with Patient 03/27/24 [phone]     (approximate)   History   Chief Complaint Abdominal Pain   HPI  Olivia [name] is a 28 y.o. female with past medical history of seizures, kidney stones, eclampsia, and bipolar disorder who presents to the ED complaining of abdominal pain.  Patient reports that she was woken from sleep about 2 hours prior to arrival with severe pain in the left lower quadrant of her abdomen.  Patient reports that pain seems to radiate up towards her left flank, has been associated with nausea and multiple episodes of dry heaving.  She denies any changes in her bowel movements, has not had any dysuria or hematuria.  She does describe symptoms as similar to prior kidney stones, but also similar to when she has been diagnosed with an ovarian cyst in the past.  Her LMP was approximately 1 month ago and she denies any vaginal bleeding or discharge.     Physical Exam   Triage Vital Signs: ED Triage Vitals  Encounter Vitals Group     BP      Systolic BP Percentile      Diastolic BP Percentile      Pulse      Resp      Temp      Temp src      SpO2      Weight      Height      Head Circumference      Peak Flow      Pain Score      Pain Loc      Pain Education      Exclude from Growth Chart     Most recent vital signs: Vitals:   03/27/24 0630 03/27/24 0645  BP: 99/68   Pulse: (!) 50 (!) 56  Resp:    Temp:    SpO2: 100% 100%    Constitutional: Alert and oriented. Eyes: Conjunctivae are normal. Head: Atraumatic. Nose: No congestion/rhinnorhea. Mouth/Throat: Mucous membranes are moist.  Cardiovascular: Normal rate, regular rhythm. Grossly normal heart sounds.  2+ radial pulses bilaterally. Respiratory: Normal respiratory effort.  No retractions. Lungs CTAB. Gastrointestinal: Soft and tender to palpation in the left lower quadrant with no rebound or guarding.   Left CVA tenderness to palpation noted. No distention. Musculoskeletal: No lower extremity tenderness nor edema.  Neurologic:  Normal speech and language. No gross focal neurologic deficits are appreciated.    ED Results / Procedures / Treatments   Labs (all labs ordered are listed, but only abnormal results are displayed) Labs Reviewed  CBC WITH DIFFERENTIAL/PLATELET - Abnormal; Notable for the following components:      Result Value   WBC 15.0 (*)    Neutro Abs 11.0 (*)    Abs Immature Granulocytes 0.09 (*)    All other components within normal limits  COMPREHENSIVE METABOLIC PANEL WITH GFR - Abnormal; Notable for the following components:   Potassium 3.4 (*)    Glucose, Bld 131 (*)    All other components within normal limits  LIPASE, BLOOD  HCG, QUANTITATIVE, PREGNANCY  URINALYSIS, ROUTINE W REFLEX MICROSCOPIC    PROCEDURES:  Critical Care performed: No  Procedures   MEDICATIONS ORDERED IN ED: Medications  morphine  (PF) 4 MG/ML injection 4 mg (4 mg Intravenous Given 03/27/24 0611)  ondansetron  (ZOFRAN ) injection 4 mg (4 mg Intravenous Given 03/27/24 0611)     IMPRESSION /  MDM / ASSESSMENT AND PLAN / ED COURSE  I reviewed the triage vital signs and the nursing notes.                              28 y.o. female with past medical history of kidney stones, bipolar disorder, eclampsia, and seizures who presents to the ED complaining of acute onset left lower quadrant abdominal pain as well as left flank pain.  Patient's presentation is most consistent with acute presentation with potential threat to life or bodily function.  Differential diagnosis includes, but is not limited to, kidney stone, ovarian cyst, ovarian torsion, pyelonephritis, cystitis, diverticulitis, colitis.  Patient uncomfortable appearing but nontoxic and in no acute distress, vital signs are unremarkable.  She has significant tenderness to palpation in the left lower quadrant of her abdomen as well as  her left costovertebral area.  Plan to further assess with CT renal protocol, treat symptomatically with IV morphine  and Zofran .  Labs are pending at this time.  Labs with leukocytosis but no significant anemia, electrolyte abnormality, or AKI.  LFTs and lipase are unremarkable, pregnancy testing is negative, urinalysis pending.  Patient turned over to oncoming provider pending CT results and reassessment.      FINAL CLINICAL IMPRESSION(S) / ED DIAGNOSES   Final diagnoses:  LLQ abdominal pain     Rx / DC Orders   ED Discharge Orders     None        Note:  This document was prepared using Dragon voice recognition software and may include unintentional dictation errors.   Twilla Galea, MD 03/27/24 [phone]

## 2024-03-28 ENCOUNTER — Ambulatory Visit (INDEPENDENT_AMBULATORY_CARE_PROVIDER_SITE_OTHER): Admitting: Urology

## 2024-03-28 ENCOUNTER — Ambulatory Visit
Admission: RE | Admit: 2024-03-28 | Discharge: 2024-03-28 | Disposition: A | Discharge status: Home / Self Care | Source: Ambulatory Visit | Attending: Urology | Admitting: Urology

## 2024-03-28 ENCOUNTER — Other Ambulatory Visit: Payer: Self-pay

## 2024-03-28 VITALS — BP 101/70 | HR 72 | Ht 60.0 in | Wt 99.0 lb

## 2024-03-28 DIAGNOSIS — N201 Calculus of ureter: Secondary | ICD-10-CM

## 2024-03-28 DIAGNOSIS — F900 Attention-deficit hyperactivity disorder, predominantly inattentive type: Secondary | ICD-10-CM | POA: Diagnosis not present

## 2024-03-28 DIAGNOSIS — F331 Major depressive disorder, recurrent, moderate: Secondary | ICD-10-CM | POA: Diagnosis not present

## 2024-03-28 DIAGNOSIS — R109 Unspecified abdominal pain: Secondary | ICD-10-CM | POA: Diagnosis not present

## 2024-03-28 DIAGNOSIS — F411 Generalized anxiety disorder: Secondary | ICD-10-CM | POA: Diagnosis not present

## 2024-03-28 DIAGNOSIS — N200 Calculus of kidney: Secondary | ICD-10-CM | POA: Diagnosis not present

## 2024-03-28 MED ORDER — ONDANSETRON HCL 4 MG/2ML IJ SOLN
4.0000 mg | Freq: Once | INTRAMUSCULAR | Status: AC
Start: 2024-03-28 — End: 2024-03-29
  Administered 2024-03-29: 4 mg via INTRAVENOUS

## 2024-03-28 MED ORDER — DIAZEPAM 5 MG PO TABS
10.0000 mg | ORAL_TABLET | ORAL | Status: AC
  Administered 2024-03-29: 10 mg via ORAL

## 2024-03-28 MED ORDER — CEPHALEXIN 500 MG PO CAPS
500.0000 mg | ORAL_CAPSULE | Freq: Once | ORAL | Status: AC
Start: 2024-03-28 — End: 2024-03-29
  Administered 2024-03-29: 500 mg via ORAL

## 2024-03-28 MED ORDER — SODIUM CHLORIDE 0.9 % IV SOLN: INTRAVENOUS | Status: DC

## 2024-03-28 MED ORDER — DIPHENHYDRAMINE HCL 25 MG PO CAPS
25.0000 mg | ORAL_CAPSULE | ORAL | Status: AC
  Administered 2024-03-29: 25 mg via ORAL

## 2024-03-28 NOTE — Progress Notes (Signed)
    [facility] ESWL POSTING SHEET        Patient Name: Olivia [name]  DOB: 02-12-1996  MRN: [id number]  Surgeon:  Dustin Gimenez, MD  Diagnosis:  Left Ureteral Stone  CPT: 40981  ESWL DATE: 03/29/2024  ESWL TIME: 0730am  Special Needs/Requirements: no       Cardiac/Medical/Pulmonary Clearance needed: no       Form Faxed to [facility]- [phone] Date:   Date: 03/28/24       Form Faxed to [place]- [phone]  Date:  Date: 03/28/24           Copy Made for Insurance PA:  Date: 03/28/24       Orders Entered in to Epic:  Date: 03/28/24  [facility] MEDICAID U[employer] AUTH# [id number] for Dates 03/29/2024-05/21/2024

## 2024-03-28 NOTE — Progress Notes (Signed)
 ESWL ORDER FORM  Expected date of procedure: 03/29/24  Surgeon: Dustin Gimenez, MD  Post op standing: 2-4wk follow up w/KUB prior  Anticoagulation/Aspirin/NSAID standing order: Hold all 72 hours prior  Anesthesia standing order: MAC  VTE standing: SCD's  Dx: Left Ureteral Stone  Procedure: left Extracorporeal shock wave lithotripsy  CPT : 50590  Standing Order Set:   *NPO after mn, NO KUB!!!!!!!!!!  *NS 126ml/hr, Keflex 500mg  PO, Benadryl  25mg  PO, Valium  10mg  PO, Zofran  4mg  IV  Medications if other than standing orders:

## 2024-03-28 NOTE — H&P (View-Only) (Signed)
 Olivia [name],acting as a scribe for Dustin Gimenez, MD.,have documented all relevant documentation on the behalf of Dustin Gimenez, MD,as directed by  Dustin Gimenez, MD while in the presence of Dustin Gimenez, MD.  03/28/24 1:14 PM   Render Olivia [name]  Referring provider: Iver Marker, MD [address] [place],  [place] [address]  Chief Complaint  Patient presents with   New Patient (Initial Visit)    HPI:  28 year old female with a personal history of kidney stones presents today for follow-up after a recent kidney stone event. She was seen yesterday in the emergency room and found to have left hydroureteronephrosis secondary to a 5-millimeter distal ureteral calculus. She has a history of passing several stones in the past and has undergone multiple CT scans.   Her urinalysis yesterday showed RBCs, WBCs, and many epithelial cells, and her creatinine was normal near her baseline. She had a slight leukocytosis at 15. The stone is approximately 500 Hounsfield units and is located near the level of the iliac vessels. No culture was sent but she was started on empiric Keflex due to her urinalysis findings and leukocytosis.   She reports that a couple of weeks ago, she began experiencing pain similar to previous kidney stone episodes, which usually resolved on its own. However, the pain intensified around 3 a.m. yesterday, prompting her to seek emergency care. She has had kidney stones since she was 77, treated at [facility], and has undergone procedures including stent placement and laser treatment.   This is the first stone she has been unable to pass since then. She is considering lithotripsy, which she has not had before, as she prefers a non-invasive approach. She is scheduled for an X-ray today to ensure the stone is visible for treatment.  PMH: Past Medical History:  Diagnosis Date   Bipolar 1 disorder (HCC)    Chronic kidney disease     Frequent kidney stones; stent placed 2012   Eclampsia during pregnancy    Seizures (HCC) 10-21-2012   x 2 ; No prior history    Surgical History: Past Surgical History:  Procedure Laterality Date   APPENDECTOMY     CESAREAN SECTION  10/22/2012   Procedure: CESAREAN SECTION;  Surgeon: Hamp Levine, MD;  Location: [facility];  Service: Obstetrics;  Laterality: N/A;   CESAREAN SECTION  07/27/2015   CYSTOSCOPY WITH STENT PLACEMENT  2012    Home Medications:  Allergies as of 03/28/2024   No Known Allergies      Medication List        Accurate as of Mar 28, 2024  1:14 PM. If you have any questions, ask your nurse or doctor.          STOP taking these medications    atomoxetine 10 MG capsule Commonly known as: STRATTERA Stopped by: Dustin Gimenez   citalopram 10 MG tablet Commonly known as: CELEXA Stopped by: Dustin Gimenez   clonazePAM 0.5 MG tablet Commonly known as: KLONOPIN Stopped by: Dustin Gimenez       TAKE these medications    cephALEXin 500 MG capsule Commonly known as: KEFLEX Take 1 capsule (500 mg total) by mouth 2 (two) times daily for 7 days.   HYDROcodone -acetaminophen  5-325 MG tablet Commonly known as: NORCO/VICODIN Take 1-2 tablets by mouth every 6 (six) hours as needed for moderate pain (pain score 4-6).   levonorgestrel  20 MCG/24HR IUD Commonly known as: MIRENA  1 each by Intrauterine route once.  naproxen  375 MG tablet Commonly known as: NAPROSYN  Take 1 tablet (375 mg total) by mouth 2 (two) times daily.   ondansetron  4 MG disintegrating tablet Commonly known as: ZOFRAN -ODT Take 1 tablet (4 mg total) by mouth every 6 (six) hours as needed for nausea or vomiting.        Family History: Family History  Problem Relation Age of Onset   Hyperlipidemia Mother    Coronary artery disease Mother 74       S/P CABG   Other Neg Hx     Social History:  reports that she has quit smoking. Her smoking use included cigarettes. She has never  used smokeless tobacco. She reports current alcohol use. She reports that she does not use drugs.   Physical Exam: BP 101/70   Pulse 72   Ht 5' (1.524 m)   Wt 99 lb (44.9 kg)   BMI 19.33 kg/m   Constitutional:  Alert and oriented, No acute distress. HEENT: [place] AT, moist mucus membranes.  Trachea midline, no masses. Neurologic: Grossly intact, no focal deficits, moving all 4 extremities. Psychiatric: Normal mood and affect.   Pertinent Imaging:     EXAM: CT ABDOMEN AND PELVIS WITHOUT CONTRAST  TECHNIQUE: Multidetector CT imaging of the abdomen and pelvis was performed following the standard protocol without IV contrast.  RADIATION DOSE REDUCTION: This exam was performed according to the departmental dose-optimization program which includes automated exposure control, adjustment of the mA and/or kV according to patient size and/or use of iterative reconstruction technique.  COMPARISON:  August 11, 2022.  FINDINGS: Lower chest: No acute abnormality.  Hepatobiliary: No focal liver abnormality is seen. No gallstones, gallbladder wall thickening, or biliary dilatation.  Pancreas: Unremarkable. No pancreatic ductal dilatation or surrounding inflammatory changes.  Spleen: Normal in size without focal abnormality.  Adrenals/Urinary Tract: Adrenal glands appear normal. Bilateral nonobstructive nephrolithiasis is noted. Mild left hydroureteronephrosis is noted secondary to 5 mm distal left ureteral calculus. Urinary bladder is decompressed.  Stomach/Bowel: Stomach is unremarkable status post appendectomy. No evidence of bowel obstruction or inflammation.  Vascular/Lymphatic: No significant vascular findings are present. No enlarged abdominal or pelvic lymph nodes.  Reproductive: Intrauterine device is noted. No adnexal abnormality is noted.  Other: No ascites or hernia is noted.  Musculoskeletal: No acute or significant osseous findings.  IMPRESSION: Bilateral  nephrolithiasis. Mild left hydroureteronephrosis is noted secondary to 5 mm distal left ureteral calculus.   Electronically Signed By: Rosalene Colon M.D. On: 03/27/2024 08:18  This was personally reviewed and I agree with the radiologic interpretation.  Assessment & Plan:    1. Distal Ureteral Calculus - She has a 5 mm left distal ureteral stone causing left hydronephrosis. -pain for several weeks now  -We discussed various treatment options for urolithiasis including observation with or without medical expulsive therapy, shockwave lithotripsy (SWL), ureteroscopy and laser lithotripsy with stent placement, and percutaneous nephrolithotomy.   We discussed that management is based on stone size, location, density, patient co-morbidities, and patient preference.    Stones <55mm in size have a >80% spontaneous passage rate. Data surrounding the use of tamsulosin for medical expulsive therapy is controversial, but meta analyses suggests it is most efficacious for distal stones between 5-24mm in size. Possible side effects include dizziness/lightheadedness, and retrograde ejaculation.   SWL has a lower stone free rate in a single procedure, but also a lower complication rate compared to ureteroscopy and avoids a stent and associated stent related symptoms. Possible complications include renal hematoma, steinstrasse,  and need for additional treatment. We discussed the role of his increased skin to stone distance can lead to decreased efficacy with shockwave lithotripsy.   Ureteroscopy with laser lithotripsy and stent placement has a higher stone free rate than SWL in a single procedure, however increased complication rate including possible infection, ureteral injury, bleeding, and stent related morbidity. Common stent related symptoms include dysuria, urgency/frequency, and flank pain.   Given the stone's location and her thin body habitus, she is a good candidate for ESWL.   - An X-ray will be  performed today to ensure the stone is visible for treatment. If the stone is not visible, ureteroscopy will be considered as an alternative.  - She will undergo ESWL tomorrow, with instructions to fast after midnight and arrange for transportation due to sedation.  - She is advised to continue current pain management and may augment with ibuprofen  as needed. - She was started on empiric antibiotics in the ER due to leukocytosis and possible urinary tract infection. She should continue the prescribed antibiotics as directed.  Return for ESWL with follow up 1 week post procedure to assess stone clearance and symptom resolution.  I have reviewed the above documentation for accuracy and completeness, and I agree with the above.   Dustin Gimenez, MD    [facility] [address] [place], [place] [address] [phone]

## 2024-03-28 NOTE — Progress Notes (Signed)
 Olivia [name],acting as a scribe for Olivia Gimenez, MD.,have documented all relevant documentation on the behalf of Olivia Gimenez, MD,as directed by  Olivia Gimenez, MD while in the presence of Olivia Gimenez, MD.  03/28/24 1:14 PM   Render Olivia [name] [id number]  Referring provider: Iver Marker, MD [address] [facility] [address] [place],  [place] [address]  Chief Complaint  Patient presents with   New Patient (Initial Visit)    HPI:  28 year old female with a personal history of kidney stones presents today for follow-up after a recent kidney stone event. She was seen yesterday in the emergency room and found to have left hydroureteronephrosis secondary to a 5-millimeter distal ureteral calculus. She has a history of passing several stones in the past and has undergone multiple CT scans.   Her urinalysis yesterday showed RBCs, WBCs, and many epithelial cells, and her creatinine was normal near her baseline. She had a slight leukocytosis at 15. The stone is approximately 500 Hounsfield units and is located near the level of the iliac vessels. No culture was sent but she was started on empiric Keflex due to her urinalysis findings and leukocytosis.   She reports that a couple of weeks ago, she began experiencing pain similar to previous kidney stone episodes, which usually resolved on its own. However, the pain intensified around 3 a.m. yesterday, prompting her to seek emergency care. She has had kidney stones since she was 77, treated at [facility], and has undergone procedures including stent placement and laser treatment.   This is the first stone she has been unable to pass since then. She is considering lithotripsy, which she has not had before, as she prefers a non-invasive approach. She is scheduled for an X-ray today to ensure the stone is visible for treatment.  PMH: Past Medical History:  Diagnosis Date   Bipolar 1 disorder (HCC)    Chronic kidney disease     Frequent kidney stones; stent placed 2012   Eclampsia during pregnancy    Seizures (HCC) 10-21-2012   x 2 ; No prior history    Surgical History: Past Surgical History:  Procedure Laterality Date   APPENDECTOMY     CESAREAN SECTION  10/22/2012   Procedure: CESAREAN SECTION;  Surgeon: Olivia Levine, MD;  Location: [facility];  Service: Obstetrics;  Laterality: N/A;   CESAREAN SECTION  07/27/2015   CYSTOSCOPY WITH STENT PLACEMENT  2012    Home Medications:  Allergies as of 03/28/2024   No Known Allergies      Medication List        Accurate as of Mar 28, 2024  1:14 PM. If you have any questions, ask your nurse or doctor.          STOP taking these medications    atomoxetine 10 MG capsule Commonly known as: STRATTERA Stopped by: Olivia [name]   citalopram 10 MG tablet Commonly known as: CELEXA Stopped by: Olivia [name]   clonazePAM 0.5 MG tablet Commonly known as: KLONOPIN Stopped by: Olivia [name]       TAKE these medications    cephALEXin 500 MG capsule Commonly known as: KEFLEX Take 1 capsule (500 mg total) by mouth 2 (two) times daily for 7 days.   HYDROcodone -acetaminophen  5-325 MG tablet Commonly known as: NORCO/VICODIN Take 1-2 tablets by mouth every 6 (six) hours as needed for moderate pain (pain score 4-6).   levonorgestrel  20 MCG/24HR IUD Commonly known as: MIRENA  1 each by Intrauterine route once.  naproxen  375 MG tablet Commonly known as: NAPROSYN  Take 1 tablet (375 mg total) by mouth 2 (two) times daily.   ondansetron  4 MG disintegrating tablet Commonly known as: ZOFRAN -ODT Take 1 tablet (4 mg total) by mouth every 6 (six) hours as needed for nausea or vomiting.        Family History: Family History  Problem Relation Age of Onset   Hyperlipidemia Mother    Coronary artery disease Mother 74       S/P CABG   Other Neg Hx     Social History:  reports that she has quit smoking. Her smoking use included cigarettes. She has never  used smokeless tobacco. She reports current alcohol use. She reports that she does not use drugs.   Physical Exam: BP 101/70   Pulse 72   Ht 5' (1.524 m)   Wt 99 lb (44.9 kg)   BMI 19.33 kg/m   Constitutional:  Alert and oriented, No acute distress. HEENT: [place] AT, moist mucus membranes.  Trachea midline, no masses. Neurologic: Grossly intact, no focal deficits, moving all 4 extremities. Psychiatric: Normal mood and affect.   Pertinent Imaging:     EXAM: CT ABDOMEN AND PELVIS WITHOUT CONTRAST  TECHNIQUE: Multidetector CT imaging of the abdomen and pelvis was performed following the standard protocol without IV contrast.  RADIATION DOSE REDUCTION: This exam was performed according to the departmental dose-optimization program which includes automated exposure control, adjustment of the mA and/or kV according to patient size and/or use of iterative reconstruction technique.  COMPARISON:  August 11, 2022.  FINDINGS: Lower chest: No acute abnormality.  Hepatobiliary: No focal liver abnormality is seen. No gallstones, gallbladder wall thickening, or biliary dilatation.  Pancreas: Unremarkable. No pancreatic ductal dilatation or surrounding inflammatory changes.  Spleen: Normal in size without focal abnormality.  Adrenals/Urinary Tract: Adrenal glands appear normal. Bilateral nonobstructive nephrolithiasis is noted. Mild left hydroureteronephrosis is noted secondary to 5 mm distal left ureteral calculus. Urinary bladder is decompressed.  Stomach/Bowel: Stomach is unremarkable status post appendectomy. No evidence of bowel obstruction or inflammation.  Vascular/Lymphatic: No significant vascular findings are present. No enlarged abdominal or pelvic lymph nodes.  Reproductive: Intrauterine device is noted. No adnexal abnormality is noted.  Other: No ascites or hernia is noted.  Musculoskeletal: No acute or significant osseous findings.  IMPRESSION: Bilateral  nephrolithiasis. Mild left hydroureteronephrosis is noted secondary to 5 mm distal left ureteral calculus.   Electronically Signed By: Olivia [name] M.D. On: 03/27/2024 08:18  This was personally reviewed and I agree with the radiologic interpretation.  Assessment & Plan:    1. Distal Ureteral Calculus - She has a 5 mm left distal ureteral stone causing left hydronephrosis. -pain for several weeks now  -We discussed various treatment options for urolithiasis including observation with or without medical expulsive therapy, shockwave lithotripsy (SWL), ureteroscopy and laser lithotripsy with stent placement, and percutaneous nephrolithotomy.   We discussed that management is based on stone size, location, density, patient co-morbidities, and patient preference.    Stones <55mm in size have a >80% spontaneous passage rate. Data surrounding the use of tamsulosin for medical expulsive therapy is controversial, but meta analyses suggests it is most efficacious for distal stones between 5-24mm in size. Possible side effects include dizziness/lightheadedness, and retrograde ejaculation.   SWL has a lower stone free rate in a single procedure, but also a lower complication rate compared to ureteroscopy and avoids a stent and associated stent related symptoms. Possible complications include renal hematoma, steinstrasse,  and need for additional treatment. We discussed the role of his increased skin to stone distance can lead to decreased efficacy with shockwave lithotripsy.   Ureteroscopy with laser lithotripsy and stent placement has a higher stone free rate than SWL in a single procedure, however increased complication rate including possible infection, ureteral injury, bleeding, and stent related morbidity. Common stent related symptoms include dysuria, urgency/frequency, and flank pain.   Given the stone's location and her thin body habitus, she is a good candidate for ESWL.   - An X-ray will be  performed today to ensure the stone is visible for treatment. If the stone is not visible, ureteroscopy will be considered as an alternative.  - She will undergo ESWL tomorrow, with instructions to fast after midnight and arrange for transportation due to sedation.  - She is advised to continue current pain management and may augment with ibuprofen  as needed. - She was started on empiric antibiotics in the ER due to leukocytosis and possible urinary tract infection. She should continue the prescribed antibiotics as directed.  Return for ESWL with follow up 1 week post procedure to assess stone clearance and symptom resolution.  I have reviewed the above documentation for accuracy and completeness, and I agree with the above.   Olivia Gimenez, MD    [facility] [address] [place], [place] [address] [phone]

## 2024-03-29 ENCOUNTER — Encounter
Admission: RE | Disposition: A | Discharge status: Home / Self Care | Payer: Self-pay | Source: Home / Self Care | Attending: Urology

## 2024-03-29 ENCOUNTER — Other Ambulatory Visit: Payer: Self-pay

## 2024-03-29 ENCOUNTER — Ambulatory Visit
Admission: RE | Admit: 2024-03-29 | Discharge: 2024-03-29 | Disposition: A | Discharge status: Home / Self Care | Attending: Urology | Admitting: Urology

## 2024-03-29 ENCOUNTER — Encounter: Payer: Self-pay | Admitting: Urology

## 2024-03-29 DIAGNOSIS — Z87442 Personal history of urinary calculi: Secondary | ICD-10-CM | POA: Diagnosis not present

## 2024-03-29 DIAGNOSIS — N132 Hydronephrosis with renal and ureteral calculous obstruction: Secondary | ICD-10-CM | POA: Diagnosis not present

## 2024-03-29 DIAGNOSIS — N201 Calculus of ureter: Secondary | ICD-10-CM

## 2024-03-29 DIAGNOSIS — N189 Chronic kidney disease, unspecified: Secondary | ICD-10-CM | POA: Insufficient documentation

## 2024-03-29 DIAGNOSIS — Z87891 Personal history of nicotine dependence: Secondary | ICD-10-CM | POA: Insufficient documentation

## 2024-03-29 DIAGNOSIS — Z01812 Encounter for preprocedural laboratory examination: Secondary | ICD-10-CM

## 2024-03-29 HISTORY — PX: EXTRACORPOREAL SHOCK WAVE LITHOTRIPSY: SHX1557

## 2024-03-29 LAB — POCT PREGNANCY, URINE: Preg Test, Ur: NEGATIVE

## 2024-03-29 SURGERY — LITHOTRIPSY, ESWL
Anesthesia: Moderate Sedation | Laterality: Left

## 2024-03-29 MED ORDER — CEPHALEXIN 500 MG PO CAPS
ORAL_CAPSULE | ORAL | Status: AC
  Filled 2024-03-29: qty 1

## 2024-03-29 MED ORDER — HYDROCODONE-ACETAMINOPHEN 5-325 MG PO TABS: 1.0000 | ORAL_TABLET | Freq: Four times a day (QID) | ORAL | 0 refills | Status: DC | PRN

## 2024-03-29 MED ORDER — DIAZEPAM 5 MG PO TABS
ORAL_TABLET | ORAL | Status: AC
  Filled 2024-03-29: qty 2

## 2024-03-29 MED ORDER — ONDANSETRON HCL 4 MG/2ML IJ SOLN
INTRAMUSCULAR | Status: AC
  Filled 2024-03-29: qty 2

## 2024-03-29 MED ORDER — TAMSULOSIN HCL 0.4 MG PO CAPS: 0.4000 mg | ORAL_CAPSULE | Freq: Every day | ORAL | 0 refills | Status: DC

## 2024-03-29 MED ORDER — DIPHENHYDRAMINE HCL 25 MG PO CAPS
ORAL_CAPSULE | ORAL | Status: AC
  Filled 2024-03-29: qty 1

## 2024-03-29 NOTE — Interval H&P Note (Signed)
 History and Physical Interval Note:  03/29/2024 8:34 AM  Render Olivia [name]  has presented today for surgery, with the diagnosis of Left Ureteral Stone.  The various methods of treatment have been discussed with the patient and family. After consideration of risks, benefits and other options for treatment, the patient has consented to  Procedure(s): LITHOTRIPSY, ESWL (Left) as a surgical intervention.  The patient's history has been reviewed, patient examined, no change in status, stable for surgery.  I have reviewed the patient's chart and labs.  Questions were answered to the patient's satisfaction.    RRR CTAB  Dustin Gimenez

## 2024-03-29 NOTE — Discharge Instructions (Signed)
 See [facility] discharge instructions in chart.

## 2024-03-30 ENCOUNTER — Encounter: Payer: Self-pay | Admitting: Urology

## 2024-04-04 DIAGNOSIS — J029 Acute pharyngitis, unspecified: Secondary | ICD-10-CM | POA: Diagnosis not present

## 2024-04-04 DIAGNOSIS — B349 Viral infection, unspecified: Secondary | ICD-10-CM | POA: Diagnosis not present

## 2024-04-04 DIAGNOSIS — R519 Headache, unspecified: Secondary | ICD-10-CM | POA: Diagnosis not present

## 2024-04-05 DIAGNOSIS — F900 Attention-deficit hyperactivity disorder, predominantly inattentive type: Secondary | ICD-10-CM | POA: Diagnosis not present

## 2024-04-05 DIAGNOSIS — F331 Major depressive disorder, recurrent, moderate: Secondary | ICD-10-CM | POA: Diagnosis not present

## 2024-04-05 DIAGNOSIS — F411 Generalized anxiety disorder: Secondary | ICD-10-CM | POA: Diagnosis not present

## 2024-04-17 DIAGNOSIS — F331 Major depressive disorder, recurrent, moderate: Secondary | ICD-10-CM | POA: Diagnosis not present

## 2024-04-17 DIAGNOSIS — F900 Attention-deficit hyperactivity disorder, predominantly inattentive type: Secondary | ICD-10-CM | POA: Diagnosis not present

## 2024-04-17 DIAGNOSIS — F411 Generalized anxiety disorder: Secondary | ICD-10-CM | POA: Diagnosis not present

## 2024-04-18 ENCOUNTER — Ambulatory Visit (INDEPENDENT_AMBULATORY_CARE_PROVIDER_SITE_OTHER): Admitting: Physician Assistant

## 2024-04-18 DIAGNOSIS — N201 Calculus of ureter: Secondary | ICD-10-CM

## 2024-04-19 NOTE — Progress Notes (Signed)
 error

## 2024-04-26 ENCOUNTER — Ambulatory Visit (INDEPENDENT_AMBULATORY_CARE_PROVIDER_SITE_OTHER): Admitting: Physician Assistant

## 2024-04-26 ENCOUNTER — Ambulatory Visit
Admission: RE | Admit: 2024-04-26 | Discharge: 2024-04-26 | Disposition: A | Discharge status: Home / Self Care | Source: Ambulatory Visit | Attending: Physician Assistant

## 2024-04-26 ENCOUNTER — Ambulatory Visit
Admission: RE | Admit: 2024-04-26 | Discharge: 2024-04-26 | Disposition: A | Discharge status: Home / Self Care | Attending: Physician Assistant | Admitting: Physician Assistant

## 2024-04-26 VITALS — BP 107/77 | HR 87 | Ht 60.0 in | Wt 100.0 lb

## 2024-04-26 DIAGNOSIS — N201 Calculus of ureter: Secondary | ICD-10-CM | POA: Diagnosis not present

## 2024-04-26 DIAGNOSIS — N200 Calculus of kidney: Secondary | ICD-10-CM | POA: Diagnosis not present

## 2024-04-26 LAB — MICROSCOPIC EXAMINATION: Epithelial Cells (non renal): >10 /HPF — AB (ref 0–10)

## 2024-04-26 LAB — URINALYSIS, COMPLETE
Bilirubin, UA: NEGATIVE
Glucose, UA: NEGATIVE
Ketones, UA: NEGATIVE
Leukocytes,UA: NEGATIVE
Nitrite, UA: NEGATIVE
RBC, UA: NEGATIVE
Specific Gravity, UA: 1.03 (ref 1.005–1.030)
Urobilinogen, Ur: 0.2 mg/dL (ref 0.2–1.0)
pH, UA: 6 (ref 5.0–7.5)

## 2024-04-26 NOTE — Progress Notes (Signed)
 04/26/2024 2:43 PM   Render Carrie September 25, 1996 [id number]  CC: Chief Complaint  Patient presents with   Nephrolithiasis   HPI: Olivia [name] is a 28 y.o. female who underwent ESWL with Dr. Ace Holder on 03/29/2024 for management of a 5 mm distal left ureteral stone who presents today for postop follow-up.  Operative note describes smudging of the stone.   Today she reports she was unable to capture any fragments.  She still having bilateral flank pain.  KUB today with interval resolution of the distal left ureteral stone.  In-office UA today positive for 1+ protein; urine microscopy with 6-10 WBCs/HPF, >10 epithelial cells/hpf, and many bacteria.  PMH: Past Medical History:  Diagnosis Date   Bipolar 1 disorder (HCC)    Chronic kidney disease    Frequent kidney stones; stent placed 2012   Eclampsia during pregnancy    Seizures (HCC) 10-21-2012   x 2 ; No prior history    Surgical History: Past Surgical History:  Procedure Laterality Date   APPENDECTOMY     CESAREAN SECTION  10/22/2012   Procedure: CESAREAN SECTION;  Surgeon: Hamp Levine, MD;  Location: [facility];  Service: Obstetrics;  Laterality: N/A;   CESAREAN SECTION  07/27/2015   CYSTOSCOPY WITH STENT PLACEMENT  2012   EXTRACORPOREAL SHOCK WAVE LITHOTRIPSY Left 03/29/2024   Procedure: LITHOTRIPSY, ESWL;  Surgeon: Dustin Gimenez, MD;  Location: [facility] ORS;  Service: Urology;  Laterality: Left;    Home Medications:  Allergies as of 04/26/2024   No Known Allergies      Medication List        Accurate as of April 26, 2024  2:43 PM. If you have any questions, ask your nurse or doctor.          STOP taking these medications    HYDROcodone -acetaminophen  5-325 MG tablet Commonly known as: NORCO/VICODIN   levonorgestrel  20 MCG/24HR IUD Commonly known as: MIRENA    naproxen  375 MG tablet Commonly known as: NAPROSYN    ondansetron  4 MG disintegrating tablet Commonly known as: ZOFRAN -ODT   tamsulosin  0.4 MG  Caps capsule Commonly known as: Flomax        TAKE these medications    amphetamine-dextroamphetamine 10 MG 24 hr capsule Commonly known as: ADDERALL XR Take 10 mg by mouth daily.   hydrOXYzine 10 MG tablet Commonly known as: ATARAX Take 10 mg by mouth 3 (three) times daily as needed.   mirtazapine 15 MG disintegrating tablet Commonly known as: REMERON SOL-TAB Take 15 mg by mouth at bedtime.   propranolol 10 MG tablet Commonly known as: INDERAL Take 10 mg by mouth 2 (two) times daily.        Allergies:  No Known Allergies  Family History: Family History  Problem Relation Age of Onset   Hyperlipidemia Mother    Coronary artery disease Mother 76       S/P CABG   Other Neg Hx     Social History:   reports that she has quit smoking. Her smoking use included cigarettes. She has never used smokeless tobacco. She reports current alcohol use. She reports that she does not use drugs.  Physical Exam: BP 107/77   Pulse 87   Ht 5' (1.524 m)   Wt 100 lb (45.4 kg)   BMI 19.53 kg/m   Constitutional:  Alert and oriented, no acute distress, nontoxic appearing HEENT: [place], AT Cardiovascular: No clubbing, cyanosis, or edema Respiratory: Normal respiratory effort, no increased work of breathing Skin: No rashes, bruises or suspicious lesions  Neurologic: Grossly intact, no focal deficits, moving all 4 extremities Psychiatric: Normal mood and affect  Laboratory Data: Results for orders placed or performed in visit on 04/26/24  Microscopic Examination   Collection Time: 04/26/24  1:31 PM   Urine  Result Value Ref Range   WBC, UA 6-10 (A) 0 - 5 /hpf   RBC, Urine 0-2 0 - 2 /hpf   Epithelial Cells (non renal) >10 (A) 0 - 10 /hpf   Mucus, UA Present (A) Not Estab.   Bacteria, UA Many (A) None seen/Few  Urinalysis, Complete   Collection Time: 04/26/24  1:31 PM  Result Value Ref Range   Specific Gravity, UA 1.030 1.005 - 1.030   pH, UA 6.0 5.0 - 7.5   Color, UA Yellow Yellow    Appearance Ur Slightly cloudy Clear   Leukocytes,UA Negative Negative   Protein,UA 1+ (A) Negative/Trace   Glucose, UA Negative Negative   Ketones, UA Negative Negative   RBC, UA Negative Negative   Bilirubin, UA Negative Negative   Urobilinogen, Ur 0.2 0.2 - 1.0 mg/dL   Nitrite, UA Negative Negative   Microscopic Examination See below:    Pertinent Imaging: KUB, 04/26/2024: See epic  I personally reviewed the images referenced above and note interval resolution of the distal left ureteral stone.  Assessment & Plan:   1. Left ureteral stone (Primary) She never saw any stones pass and is still having some flank pain.  Overall she is rather comfortable.  UA contaminated, though will send for culture given persistent symptoms.  She is not clinically infected.  I recommended pushing fluids and continuing Flomax  with plans for follow-up in 3 weeks with renal ultrasound prior.  She is in agreement. - Urinalysis, Complete - CULTURE, URINE COMPREHENSIVE - US  RENAL; Future   Return in about 3 weeks (around 05/17/2024) for Stone f/u with RUS prior and UA.  Kathreen Pare, PA-C  [facility] [place] [address] [place], [place] [address] [phone]

## 2024-04-29 LAB — CULTURE, URINE COMPREHENSIVE

## 2024-05-01 ENCOUNTER — Ambulatory Visit: Payer: Self-pay | Admitting: Physician Assistant

## 2024-05-24 ENCOUNTER — Ambulatory Visit: Admitting: Physician Assistant

## 2024-06-18 ENCOUNTER — Ambulatory Visit (INDEPENDENT_AMBULATORY_CARE_PROVIDER_SITE_OTHER): Admitting: Physician Assistant

## 2024-06-18 VITALS — BP 110/74 | HR 68 | Ht 60.0 in | Wt 105.0 lb

## 2024-06-18 DIAGNOSIS — R109 Unspecified abdominal pain: Secondary | ICD-10-CM | POA: Diagnosis not present

## 2024-06-18 DIAGNOSIS — Z87442 Personal history of urinary calculi: Secondary | ICD-10-CM | POA: Diagnosis not present

## 2024-06-18 DIAGNOSIS — N201 Calculus of ureter: Secondary | ICD-10-CM | POA: Diagnosis not present

## 2024-06-18 LAB — URINALYSIS, COMPLETE
Bilirubin, UA: NEGATIVE
Glucose, UA: NEGATIVE
Ketones, UA: NEGATIVE
Nitrite, UA: NEGATIVE
Protein,UA: NEGATIVE
Specific Gravity, UA: 1.025 (ref 1.005–1.030)
Urobilinogen, Ur: 0.2 mg/dL (ref 0.2–1.0)
pH, UA: 6 (ref 5.0–7.5)

## 2024-06-18 LAB — MICROSCOPIC EXAMINATION
Epithelial Cells (non renal): >10 /HPF — AB (ref 0–10)
WBC, UA: >30 /HPF — AB (ref 0–5)

## 2024-06-18 MED ORDER — SULFAMETHOXAZOLE-TRIMETHOPRIM 800-160 MG PO TABS: 1.0000 | ORAL_TABLET | Freq: Two times a day (BID) | ORAL | 0 refills | Status: AC

## 2024-06-18 NOTE — Progress Notes (Signed)
 06/18/2024 3:50 PM   Olivia [name] 1996-11-21 [id number]  CC: Chief Complaint  Patient presents with   Cystitis   HPI: Olivia [name] is a 28 y.o. female with PMH nephrolithiasis who presents today for evaluation of possible UTI.   I saw her in clinic most recently on 04/26/2024 for follow-up of ESWL of a 5 mm distal left ureteral stone.  She is still having bilateral flank pain at the time and I ordered a renal ultrasound for follow-up.  This has not been completed.  Today she reports 6 days of urinary frequency, dysuria, malodorous urine, headache, and right flank pain.  She denies fever, chills, nausea, or vomiting.  She has an IUD and reports no chance of pregnancy.  In-office UA today positive for trace intact blood and 1+ leukocytes; urine microscopy with >30 WBCs/HPF, 3-10 RBCs/HPF, >10 epithelial cells/hpf, and many bacteria.  PMH: Past Medical History:  Diagnosis Date   Bipolar 1 disorder (HCC)    Chronic kidney disease    Frequent kidney stones; stent placed 2012   Eclampsia during pregnancy    Seizures (HCC) 10-21-2012   x 2 ; No prior history    Surgical History: Past Surgical History:  Procedure Laterality Date   APPENDECTOMY     CESAREAN SECTION  10/22/2012   Procedure: CESAREAN SECTION;  Surgeon: Oneil FORBES Piety, MD;  Location: [facility];  Service: Obstetrics;  Laterality: N/A;   CESAREAN SECTION  07/27/2015   CYSTOSCOPY WITH STENT PLACEMENT  2012   EXTRACORPOREAL SHOCK WAVE LITHOTRIPSY Left 03/29/2024   Procedure: LITHOTRIPSY, ESWL;  Surgeon: Penne Knee, MD;  Location: [facility] ORS;  Service: Urology;  Laterality: Left;    Home Medications:  Allergies as of 06/18/2024   No Known Allergies      Medication List        Accurate as of June 18, 2024  3:50 PM. If you have any questions, ask your nurse or doctor.          amphetamine-dextroamphetamine 10 MG 24 hr capsule Commonly known as: ADDERALL XR Take 10 mg by mouth daily.   hydrOXYzine 10 MG  tablet Commonly known as: ATARAX Take 10 mg by mouth 3 (three) times daily as needed.   mirtazapine 15 MG disintegrating tablet Commonly known as: REMERON SOL-TAB Take 15 mg by mouth at bedtime.   propranolol 10 MG tablet Commonly known as: INDERAL Take 10 mg by mouth 2 (two) times daily.        Allergies:  No Known Allergies  Family History: Family History  Problem Relation Age of Onset   Hyperlipidemia Mother    Coronary artery disease Mother 67       S/P CABG   Other Neg Hx     Social History:   reports that she has quit smoking. Her smoking use included cigarettes. She has never used smokeless tobacco. She reports current alcohol use. She reports that she does not use drugs.  Physical Exam: BP 110/74   Pulse 68   Ht 5' (1.524 m)   Wt 105 lb (47.6 kg)   BMI 20.51 kg/m   Constitutional:  Alert and oriented, no acute distress, nontoxic appearing HEENT: [place], AT Cardiovascular: No clubbing, cyanosis, or edema Respiratory: Normal respiratory effort, no increased work of breathing Skin: No rashes, bruises or suspicious lesions Neurologic: Grossly intact, no focal deficits, moving all 4 extremities Psychiatric: Normal mood and affect  Laboratory Data: Results for orders placed or performed in visit on 06/18/24  Microscopic Examination  Collection Time: 06/18/24  3:25 PM   Urine  Result Value Ref Range   WBC, UA >30 (A) 0 - 5 /hpf   RBC, Urine 3-10 (A) 0 - 2 /hpf   Epithelial Cells (non renal) >10 (A) 0 - 10 /hpf   Bacteria, UA Many (A) None seen/Few  Urinalysis, Complete   Collection Time: 06/18/24  3:25 PM  Result Value Ref Range   Specific Gravity, UA 1.025 1.005 - 1.030   pH, UA 6.0 5.0 - 7.5   Color, UA Yellow Yellow   Appearance Ur Slightly cloudy Clear   Leukocytes,UA 1+ (A) Negative   Protein,UA Negative Negative/Trace   Glucose, UA Negative Negative   Ketones, UA Negative Negative   RBC, UA Trace (A) Negative   Bilirubin, UA Negative Negative    Urobilinogen, Ur 0.2 0.2 - 1.0 mg/dL   Nitrite, UA Negative Negative   Microscopic Examination See below:    Assessment & Plan:   1. Flank pain with history of urolithiasis (Primary) UA appears grossly infected, will start empiric Bactrim  and send for culture for further evaluation.  Given recent stone episodes and reports of right flank pain, will get a renal ultrasound tomorrow.  May extend antibiotics based on findings.  We discussed return precautions including fever, uncontrolled pain, or nausea/vomiting. - Urinalysis, Complete - CULTURE, URINE COMPREHENSIVE - US  RENAL; Future - sulfamethoxazole -trimethoprim  (BACTRIM  DS) 800-160 MG tablet; Take 1 tablet by mouth 2 (two) times daily for 5 days.  Dispense: 10 tablet; Refill: 0   Return for Will call with results.  Lucie Hones, PA-C  [facility] [place] [address] [place], [place] [address] [phone]

## 2024-06-19 ENCOUNTER — Ambulatory Visit (HOSPITAL_COMMUNITY)
Admission: RE | Admit: 2024-06-19 | Discharge: 2024-06-19 | Disposition: A | Discharge status: Home / Self Care | Source: Ambulatory Visit | Attending: Physician Assistant | Admitting: Physician Assistant

## 2024-06-19 ENCOUNTER — Ambulatory Visit: Payer: Self-pay | Admitting: Physician Assistant

## 2024-06-19 DIAGNOSIS — R109 Unspecified abdominal pain: Secondary | ICD-10-CM | POA: Insufficient documentation

## 2024-06-19 DIAGNOSIS — Z87442 Personal history of urinary calculi: Secondary | ICD-10-CM | POA: Diagnosis not present

## 2024-06-19 DIAGNOSIS — N390 Urinary tract infection, site not specified: Secondary | ICD-10-CM | POA: Diagnosis not present

## 2024-06-21 LAB — CULTURE, URINE COMPREHENSIVE

## 2024-06-22 ENCOUNTER — Ambulatory Visit
Admission: RE | Admit: 2024-06-22 | Discharge: 2024-06-22 | Disposition: A | Discharge status: Home / Self Care | Payer: Self-pay | Source: Ambulatory Visit | Attending: Family Medicine | Admitting: Family Medicine

## 2024-06-22 VITALS — BP 113/77 | HR 69 | Temp 98.2°F | Resp 17

## 2024-06-22 DIAGNOSIS — N760 Acute vaginitis: Secondary | ICD-10-CM | POA: Diagnosis not present

## 2024-06-22 DIAGNOSIS — B379 Candidiasis, unspecified: Secondary | ICD-10-CM | POA: Diagnosis not present

## 2024-06-22 DIAGNOSIS — Z9189 Other specified personal risk factors, not elsewhere classified: Secondary | ICD-10-CM | POA: Diagnosis not present

## 2024-06-22 DIAGNOSIS — Z3202 Encounter for pregnancy test, result negative: Secondary | ICD-10-CM | POA: Diagnosis not present

## 2024-06-22 DIAGNOSIS — T3695XA Adverse effect of unspecified systemic antibiotic, initial encounter: Secondary | ICD-10-CM | POA: Diagnosis not present

## 2024-06-22 LAB — POCT URINE PREGNANCY: Preg Test, Ur: NEGATIVE

## 2024-06-22 MED ORDER — FLUCONAZOLE 150 MG PO TABS: 150.0000 mg | ORAL_TABLET | ORAL | 0 refills | Status: DC

## 2024-06-22 MED ORDER — CEFTRIAXONE SODIUM 500 MG IJ SOLR
500.0000 mg | INTRAMUSCULAR | Status: DC
  Administered 2024-06-22: 500 mg via INTRAMUSCULAR

## 2024-06-22 NOTE — ED Triage Notes (Addendum)
 Pt c/o green/yellow lumpy discharge, vaginal itching for 2 days.  States she is currently on abx for UTI.  Pt would also like STD testing with swab

## 2024-06-22 NOTE — ED Provider Notes (Signed)
 [facility]    CSN: [id number] Arrival date & time: 06/22/24  1312      History   Chief Complaint Chief Complaint  Patient presents with   Vaginal Discharge    Entered by patient    HPI Olivia [name] is a 28 y.o. female.   Olivia [name] is a 28 y.o. female presenting for chief complaint of vaginal discharge that started 2 days ago. Vaginal discharge is light green/yellow in color, thick/clumpy, and with odor. Discharge does not smell fishy like typical BV. Reports vaginal itching as well.  She is currently taking bactrim  antibiotic to treat UTI as prescribed by urology, history of vaginal yeast infections with antibiotic use. States current vaginal symptoms do not feel similar to any yeast infection or BV she has had in the past. She is concerned she may have an STI. Reports recent unprotected sex with new female partner. No known exposures to STD, no rashes. Denies fever, chills, pelvic pain, dyspareunia, back pain, n/v/d, and chance of pregnancy. She has an IUD.  She has not attempted use of any OTC medications to help with symptoms PTA.    Vaginal Discharge   Past Medical History:  Diagnosis Date   Bipolar 1 disorder (HCC)    Chronic kidney disease    Frequent kidney stones; stent placed 2012   Eclampsia during pregnancy    Seizures (HCC) 10-21-2012   x 2 ; No prior history    Patient Active Problem List   Diagnosis Date Noted   Kidney stone 03/27/2019   Incisional hernia, without obstruction or gangrene 04/14/2016   History of eclampsia 02/14/2015   Acute blood loss anemia 10/29/2012   Flash pulmonary edema (HCC) 10/27/2012   Eclampsia 10/21/12 (seizures x 2) 10/27/2012   S/P LTSC cesarean section 10/22/12 @ 38 wks 10/27/2012   HTN (hypertension) 10/27/2012    Past Surgical History:  Procedure Laterality Date   APPENDECTOMY     CESAREAN SECTION  10/22/2012   Procedure: CESAREAN SECTION;  Surgeon: Oneil FORBES Piety, MD;  Location: [facility];  Service:  Obstetrics;  Laterality: N/A;   CESAREAN SECTION  07/27/2015   CYSTOSCOPY WITH STENT PLACEMENT  2012   EXTRACORPOREAL SHOCK WAVE LITHOTRIPSY Left 03/29/2024   Procedure: LITHOTRIPSY, ESWL;  Surgeon: Penne Knee, MD;  Location: [facility];  Service: Urology;  Laterality: Left;    OB History     Gravida  2   Para  2   Term  1   Preterm  1   AB  0   Living  3      SAB  0   IAB  0   Ectopic  0   Multiple  1   Live Births  3            Home Medications    Prior to Admission medications   Medication Sig Start Date End Date Taking? Authorizing Provider  fluconazole (DIFLUCAN) 150 MG tablet Take 1 tablet (150 mg total) by mouth every 3 (three) days. 06/22/24  Yes Enedelia Dorna HERO, FNP  amphetamine-dextroamphetamine (ADDERALL XR) 10 MG 24 hr capsule Take 10 mg by mouth daily.    [provider]  hydrOXYzine (ATARAX) 10 MG tablet Take 10 mg by mouth 3 (three) times daily as needed.    [provider]  mirtazapine (REMERON SOL-TAB) 15 MG disintegrating tablet Take 15 mg by mouth at bedtime.    [provider]  propranolol (INDERAL) 10 MG tablet Take 10 mg by mouth  2 (two) times daily.    [provider]  sulfamethoxazole -trimethoprim  (BACTRIM  DS) 800-160 MG tablet Take 1 tablet by mouth 2 (two) times daily for 5 days. 06/18/24 06/23/24  Vaillancourt, Samantha, PA-C    Family History Family History  Problem Relation Age of Onset   Hyperlipidemia Mother    Coronary artery disease Mother 39       S/P CABG   Other Neg Hx     Social History Social History   Tobacco Use   Smoking status: Former    Types: Cigarettes   Smokeless tobacco: Never  Vaping Use   Vaping status: Every Day  Substance Use Topics   Alcohol use: Yes    Comment: occasionally   Drug use: No     Allergies   Patient has no known allergies.   Review of Systems Review of Systems  Genitourinary:  Positive for vaginal discharge.  Per HPI   Physical  Exam Triage Vital Signs ED Triage Vitals  Encounter Vitals Group     BP 06/22/24 1329 113/77     Girls Systolic BP Percentile --      Girls Diastolic BP Percentile --      Boys Systolic BP Percentile --      Boys Diastolic BP Percentile --      Pulse Rate 06/22/24 1329 69     Resp 06/22/24 1329 17     Temp 06/22/24 1329 98.2 F (36.8 C)     Temp Source 06/22/24 1329 Oral     SpO2 06/22/24 1329 98 %     Weight --      Height --      Head Circumference --      Peak Flow --      Pain Score 06/22/24 1332 0     Pain Loc --      Pain Education --      Exclude from Growth Chart --    No data found.  Updated Vital Signs BP 113/77 (BP Location: Right Arm)   Pulse 69   Temp 98.2 F (36.8 C) (Oral)   Resp 17   SpO2 98%   Visual Acuity Right Eye Distance:   Left Eye Distance:   Bilateral Distance:    Right Eye Near:   Left Eye Near:    Bilateral Near:     Physical Exam Vitals and nursing note reviewed.  Constitutional:      Appearance: She is not ill-appearing or toxic-appearing.  HENT:     Head: Normocephalic and atraumatic.     Right Ear: Hearing and external ear normal.     Left Ear: Hearing and external ear normal.     Nose: Nose normal.     Mouth/Throat:     Lips: Pink.  Eyes:     General: Lids are normal. Vision grossly intact. Gaze aligned appropriately.     Extraocular Movements: Extraocular movements intact.     Conjunctiva/sclera: Conjunctivae normal.  Pulmonary:     Effort: Pulmonary effort is normal.  Genitourinary:    Comments: Deferred.  Musculoskeletal:     Cervical back: Neck supple.  Skin:    General: Skin is warm and dry.     Capillary Refill: Capillary refill takes less than 2 seconds.     Findings: No rash.  Neurological:     General: No focal deficit present.     Mental Status: She is alert and oriented to person, place, and time. Mental status is at baseline.  Cranial Nerves: No dysarthria or facial asymmetry.  Psychiatric:         Mood and Affect: Mood normal.        Speech: Speech normal.        Behavior: Behavior normal.        Thought Content: Thought content normal.        Judgment: Judgment normal.      UC Treatments / Results  Labs (all labs ordered are listed, but only abnormal results are displayed) Labs Reviewed  RPR  HIV ANTIBODY (ROUTINE TESTING W REFLEX)  POCT URINE PREGNANCY  CERVICOVAGINAL ANCILLARY ONLY    EKG   Radiology No results found.  Procedures Procedures (including critical care time)  Medications Ordered in UC Medications - No data to display  Initial Impression / Assessment and Plan / UC Course  I have reviewed the triage vital signs and the nursing notes.  Pertinent labs & imaging results that were available during my care of the patient were reviewed by me and considered in my medical decision making (see chart for details).  1. At risk for STD due to unprotected sex, acute vaginitis, urine pregnancy test negative, antibiotic induced yeast infection High clinical suspicion for gonorrhea infection given presentation, therefore will go ahead and empirically treat with rocephin.  Fluconazole prescribed for empiric treatment of suspected yeast vaginitis due to antibiotic use.   Urine pregnancy testing is negative.  STI labs pending, will notify patient of positive results and treat accordingly when labs result for other infection(s). Patient would like HIV and syphilis testing today.   Safe sex precautions discussed, advised to notify sexual partners of positive test results.    Counseled patient on potential for adverse effects with medications prescribed/recommended today, strict ER and return-to-clinic precautions discussed, patient verbalized understanding.    Final Clinical Impressions(s) / UC Diagnoses   Final diagnoses:  At risk for sexually transmitted disease due to unprotected sex  Acute vaginitis  Urine pregnancy test negative  Antibiotic-induced yeast  infection     Discharge Instructions      I suspect that your symptoms are due to an STD (gonorrhea, chlamydia, or trichomonas).  We have started treatment for this today due to concern/symptoms.  Your swab has been sent for testing, staff will call you in 2-3 days if you test positive for any other infections and will provide treatment at that time.  Avoid sexual intercourse for 7 days while receiving treatment. To minimize the risk of reinfection, you should abstain from sexual intercourse until your sexual partners have been tested and treated.  Advise your sexual partner(s) to be evaluated, tested and treated.  This includes all sexual partners within the past 60 days or your last sexual partner if last contact was greater than 60 days.  Return to urgent care as needed and follow-up with your primary care provider for further evaluation and management of your symptoms..  I hope you feel better!       ED Prescriptions     Medication Sig Dispense Auth. Provider   fluconazole (DIFLUCAN) 150 MG tablet Take 1 tablet (150 mg total) by mouth every 3 (three) days. 2 tablet Enedelia Dorna HERO, FNP      PDMP not reviewed this encounter.   Enedelia Dorna HERO, [place] 06/22/24 2209

## 2024-06-22 NOTE — Discharge Instructions (Signed)
I suspect that your symptoms are due to an STD (gonorrhea, chlamydia, or trichomonas).  We have started treatment for this today due to concern/symptoms.  Your swab has been sent for testing, staff will call you in 2-3 days if you test positive for any other infections and will provide treatment at that time.  Avoid sexual intercourse for 7 days while receiving treatment. To minimize the risk of reinfection, you should abstain from sexual intercourse until your sexual partners have been tested and treated.  Advise your sexual partner(s) to be evaluated, tested and treated.  This includes all sexual partners within the past 60 days or your last sexual partner if last contact was greater than 60 days.  Return to urgent care as needed and follow-up with your primary care provider for further evaluation and management of your symptoms..  I hope you feel better!

## 2024-06-24 LAB — HIV ANTIBODY (ROUTINE TESTING W REFLEX): HIV Screen 4th Generation wRfx: NONREACTIVE

## 2024-06-24 LAB — RPR: RPR Ser Ql: NONREACTIVE

## 2024-06-25 LAB — CERVICOVAGINAL ANCILLARY ONLY
Bacterial Vaginitis (gardnerella): POSITIVE — AB
Candida Glabrata: NEGATIVE
Candida Vaginitis: POSITIVE — AB
Chlamydia: NEGATIVE
Comment: NEGATIVE
Comment: NEGATIVE
Comment: NEGATIVE
Comment: NEGATIVE
Comment: NEGATIVE
Comment: NORMAL
Neisseria Gonorrhea: NEGATIVE
Trichomonas: NEGATIVE

## 2024-06-26 ENCOUNTER — Ambulatory Visit (HOSPITAL_COMMUNITY): Payer: Self-pay

## 2024-06-26 MED ORDER — METRONIDAZOLE 500 MG PO TABS
500.0000 mg | ORAL_TABLET | Freq: Two times a day (BID) | ORAL | 0 refills | Status: AC
Start: 2024-06-26 — End: 2024-07-03

## 2024-06-29 DIAGNOSIS — F411 Generalized anxiety disorder: Secondary | ICD-10-CM | POA: Diagnosis not present

## 2024-06-29 DIAGNOSIS — F900 Attention-deficit hyperactivity disorder, predominantly inattentive type: Secondary | ICD-10-CM | POA: Diagnosis not present

## 2024-06-29 DIAGNOSIS — F331 Major depressive disorder, recurrent, moderate: Secondary | ICD-10-CM | POA: Diagnosis not present

## 2024-07-17 DIAGNOSIS — F331 Major depressive disorder, recurrent, moderate: Secondary | ICD-10-CM | POA: Diagnosis not present

## 2024-07-17 DIAGNOSIS — F900 Attention-deficit hyperactivity disorder, predominantly inattentive type: Secondary | ICD-10-CM | POA: Diagnosis not present

## 2024-07-17 DIAGNOSIS — F411 Generalized anxiety disorder: Secondary | ICD-10-CM | POA: Diagnosis not present

## 2024-07-25 ENCOUNTER — Ambulatory Visit
Admission: EM | Admit: 2024-07-25 | Discharge: 2024-07-25 | Disposition: A | Discharge status: Home / Self Care | Attending: Emergency Medicine | Admitting: Emergency Medicine

## 2024-07-25 ENCOUNTER — Encounter: Payer: Self-pay | Admitting: Emergency Medicine

## 2024-07-25 DIAGNOSIS — N760 Acute vaginitis: Secondary | ICD-10-CM | POA: Diagnosis not present

## 2024-07-25 LAB — POCT URINE DIPSTICK
Bilirubin, UA: NEGATIVE
Blood, UA: NEGATIVE
Glucose, UA: NEGATIVE mg/dL
Ketones, POC UA: NEGATIVE mg/dL
Leukocytes, UA: NEGATIVE
Nitrite, UA: NEGATIVE
POC PROTEIN,UA: NEGATIVE
Spec Grav, UA: 1.015 (ref 1.010–1.025)
Urobilinogen, UA: 0.2 U/dL
pH, UA: 7.5 (ref 5.0–8.0)

## 2024-07-25 MED ORDER — METRONIDAZOLE 500 MG PO TABS: 500.0000 mg | ORAL_TABLET | Freq: Two times a day (BID) | ORAL | 0 refills | Status: AC

## 2024-07-25 MED ORDER — FLUCONAZOLE 150 MG PO TABS: ORAL_TABLET | ORAL | 0 refills | Status: AC

## 2024-07-25 NOTE — ED Triage Notes (Signed)
 Pt was seen 8/1 for yeast symptoms and had positive bv and yeast cyto. She was treated.   On Friday she had yeast symptoms again and did OTC treatment. In last 24-48 developed watery clear/grey discharge with fishy odor.

## 2024-07-25 NOTE — Discharge Instructions (Signed)
 Take the Flagyl  twice daily for the next 7 days and the Diflucan  as prescribed.  Our staff will contact you if any additional treatment is needed based on results.  I suggest following up with OB/GYN for routine evaluation, especially if these infections continue.  Return to clinic for any new or urgent symptoms.

## 2024-07-25 NOTE — ED Provider Notes (Signed)
 [facility]    CSN: [id number] Arrival date & time: 07/25/24  1334      History   Chief Complaint Chief Complaint  Patient presents with   Vaginitis    HPI Olivia [name] is a 28 y.o. female.   Patient presents to clinic over concern of symptoms consistent with bacterial vaginosis.  Reports she has a gray discharge with a fishy odor.  Last week she felt like she was having yeast symptoms with vaginal itching and 'yeastlike discharge' so she did a over-the-counter treatment for this.  Vaginal itching and yeast symptoms improved and then shortly after symptoms of 'BV' develop.  Has not had any recent new sexual partners.  Denies abdominal pain, nausea, vomiting or pelvic pain.  Afebrile.  Seen at the beginning of August and had positive BV and yeast, treated for both.  Prior to this was treated for UTI, does follow with urology.  Does not have an OB/GYN.  She has an IUD.  The history is provided by the patient and medical records.    Past Medical History:  Diagnosis Date   Bipolar 1 disorder (HCC)    Chronic kidney disease    Frequent kidney stones; stent placed 2012   Eclampsia during pregnancy    Seizures (HCC) 10-21-2012   x 2 ; No prior history    Patient Active Problem List   Diagnosis Date Noted   Kidney stone 03/27/2019   Incisional hernia, without obstruction or gangrene 04/14/2016   History of eclampsia 02/14/2015   Acute blood loss anemia 10/29/2012   Flash pulmonary edema (HCC) 10/27/2012   Eclampsia 10/21/12 (seizures x 2) 10/27/2012   S/P LTSC cesarean section 10/22/12 @ 38 wks 10/27/2012   HTN (hypertension) 10/27/2012    Past Surgical History:  Procedure Laterality Date   APPENDECTOMY     CESAREAN SECTION  10/22/2012   Procedure: CESAREAN SECTION;  Surgeon: Oneil FORBES Piety, MD;  Location: [facility];  Service: Obstetrics;  Laterality: N/A;   CESAREAN SECTION  07/27/2015   CYSTOSCOPY WITH STENT PLACEMENT  2012   EXTRACORPOREAL SHOCK WAVE  LITHOTRIPSY Left 03/29/2024   Procedure: LITHOTRIPSY, ESWL;  Surgeon: Penne Knee, MD;  Location: [facility];  Service: Urology;  Laterality: Left;    OB History     Gravida  2   Para  2   Term  1   Preterm  1   AB  0   Living  3      SAB  0   IAB  0   Ectopic  0   Multiple  1   Live Births  3            Home Medications    Prior to Admission medications   Medication Sig Start Date End Date Taking? Authorizing Provider  fluconazole  (DIFLUCAN ) 150 MG tablet Take 1 tablet on day 3 or 4 of antibiotics and another tablet on the last day of antibiotics. 07/25/24  Yes Shadavia Dampier  N, FNP  metroNIDAZOLE  (FLAGYL ) 500 MG tablet Take 1 tablet (500 mg total) by mouth 2 (two) times daily. 07/25/24  Yes Luis Sami  N, FNP  amphetamine-dextroamphetamine (ADDERALL XR) 10 MG 24 hr capsule Take 10 mg by mouth daily.    [provider]  hydrOXYzine (ATARAX) 10 MG tablet Take 10 mg by mouth 3 (three) times daily as needed.    [provider]  mirtazapine (REMERON SOL-TAB) 15 MG disintegrating tablet Take 15 mg by mouth at bedtime.  [provider]  propranolol (INDERAL) 10 MG tablet Take 10 mg by mouth 2 (two) times daily.    [provider]    Family History Family History  Problem Relation Age of Onset   Hyperlipidemia Mother    Coronary artery disease Mother 49       S/P CABG   Other Neg Hx     Social History Social History   Tobacco Use   Smoking status: Former    Types: Cigarettes   Smokeless tobacco: Never  Vaping Use   Vaping status: Every Day  Substance Use Topics   Alcohol use: Yes    Comment: occasionally   Drug use: No     Allergies   Patient has no known allergies.   Review of Systems Review of Systems  Per HPI  Physical Exam Triage Vital Signs ED Triage Vitals [07/25/24 1431]  Encounter Vitals Group     BP 105/73     Girls Systolic BP Percentile      Girls Diastolic BP Percentile      Boys  Systolic BP Percentile      Boys Diastolic BP Percentile      Pulse Rate 61     Resp 17     Temp 98 F (36.7 C)     Temp Source Oral     SpO2 97 %     Weight      Height      Head Circumference      Peak Flow      Pain Score 0     Pain Loc      Pain Education      Exclude from Growth Chart    No data found.  Updated Vital Signs BP 105/73 (BP Location: Right Arm)   Pulse 61   Temp 98 F (36.7 C) (Oral)   Resp 17   SpO2 97%   Visual Acuity Right Eye Distance:   Left Eye Distance:   Bilateral Distance:    Right Eye Near:   Left Eye Near:    Bilateral Near:     Physical Exam Vitals and nursing note reviewed.  Constitutional:      Appearance: Normal appearance.  HENT:     Head: Normocephalic and atraumatic.     Right Ear: External ear normal.     Left Ear: External ear normal.     Nose: Nose normal.     Mouth/Throat:     Mouth: Mucous membranes are moist.  Eyes:     Conjunctiva/sclera: Conjunctivae normal.  Cardiovascular:     Rate and Rhythm: Normal rate.  Pulmonary:     Effort: Pulmonary effort is normal. No respiratory distress.  Skin:    General: Skin is warm and dry.  Neurological:     General: No focal deficit present.     Mental Status: She is alert and oriented to person, place, and time.  Psychiatric:        Mood and Affect: Mood normal.        Behavior: Behavior normal.      UC Treatments / Results  Labs (all labs ordered are listed, but only abnormal results are displayed) Labs Reviewed  POCT URINE DIPSTICK  CERVICOVAGINAL ANCILLARY ONLY    EKG   Radiology No results found.  Procedures Procedures (including critical care time)  Medications Ordered in UC Medications - No data to display  Initial Impression / Assessment and Plan / UC Course  I have reviewed the triage vital  signs and the nursing notes.  Pertinent labs & imaging results that were available during my care of the patient were reviewed by me and considered in my  medical decision making (see chart for details).  Vitals and triage reviewed, patient is hemodynamically stable.  Gray vaginal discharge with fishy odor consistent with BV, will treat empirically with Flagyl .  Will send in Diflucan  to help prevent antibiotic induced yeast infection.  Cytology swab obtained and staff will contact if additional treatment is needed.  Without urinary symptoms, UA unremarkable.  IUD for contraception.  Plan of care, follow-up care return precautions given, no questions at this time.     Final Clinical Impressions(s) / UC Diagnoses   Final diagnoses:  Acute vaginitis     Discharge Instructions      Take the Flagyl  twice daily for the next 7 days and the Diflucan  as prescribed.  Our staff will contact you if any additional treatment is needed based on results.  I suggest following up with OB/GYN for routine evaluation, especially if these infections continue.  Return to clinic for any new or urgent symptoms.    ED Prescriptions     Medication Sig Dispense Auth. Provider   metroNIDAZOLE  (FLAGYL ) 500 MG tablet Take 1 tablet (500 mg total) by mouth 2 (two) times daily. 14 tablet Raphael Fitzpatrick  N, FNP   fluconazole  (DIFLUCAN ) 150 MG tablet Take 1 tablet on day 3 or 4 of antibiotics and another tablet on the last day of antibiotics. 2 tablet Dreama, Navreet Bolda  N, FNP      PDMP not reviewed this encounter.   Dreama Raenelle SAILOR, FNP 07/25/24 1457

## 2024-07-26 LAB — CERVICOVAGINAL ANCILLARY ONLY
Bacterial Vaginitis (gardnerella): POSITIVE — AB
Candida Glabrata: NEGATIVE
Candida Vaginitis: NEGATIVE
Chlamydia: NEGATIVE
Comment: NEGATIVE
Comment: NEGATIVE
Comment: NEGATIVE
Comment: NEGATIVE
Comment: NEGATIVE
Comment: NORMAL
Neisseria Gonorrhea: NEGATIVE
Trichomonas: NEGATIVE

## 2024-07-27 ENCOUNTER — Ambulatory Visit (HOSPITAL_COMMUNITY): Payer: Self-pay | Admitting: Emergency Medicine

## 2024-10-02 DIAGNOSIS — F411 Generalized anxiety disorder: Secondary | ICD-10-CM | POA: Diagnosis not present

## 2024-10-02 DIAGNOSIS — F900 Attention-deficit hyperactivity disorder, predominantly inattentive type: Secondary | ICD-10-CM | POA: Diagnosis not present

## 2024-10-02 DIAGNOSIS — F331 Major depressive disorder, recurrent, moderate: Secondary | ICD-10-CM | POA: Diagnosis not present
